# Patient Record
Sex: Female | Born: 1975
Health system: Southern US, Community
[De-identification: ages and names within clinical notes are randomized; demographics above are authoritative.]

## PROBLEM LIST (undated history)

## (undated) DIAGNOSIS — D649 Anemia, unspecified: Secondary | ICD-10-CM

## (undated) DIAGNOSIS — F419 Anxiety disorder, unspecified: Secondary | ICD-10-CM

## (undated) DIAGNOSIS — T7840XA Allergy, unspecified, initial encounter: Secondary | ICD-10-CM

## (undated) DIAGNOSIS — F53 Postpartum depression: Secondary | ICD-10-CM

## (undated) DIAGNOSIS — C801 Malignant (primary) neoplasm, unspecified: Secondary | ICD-10-CM

## (undated) DIAGNOSIS — K219 Gastro-esophageal reflux disease without esophagitis: Secondary | ICD-10-CM

## (undated) DIAGNOSIS — E079 Disorder of thyroid, unspecified: Secondary | ICD-10-CM

## (undated) DIAGNOSIS — J45909 Unspecified asthma, uncomplicated: Secondary | ICD-10-CM

## (undated) DIAGNOSIS — O99345 Other mental disorders complicating the puerperium: Secondary | ICD-10-CM

## (undated) HISTORY — DX: Allergy, unspecified, initial encounter: T78.40XA

## (undated) HISTORY — DX: Malignant (primary) neoplasm, unspecified: C80.1

## (undated) HISTORY — DX: Disorder of thyroid, unspecified: E07.9

## (undated) HISTORY — DX: Anemia, unspecified: D64.9

## (undated) HISTORY — DX: Unspecified asthma, uncomplicated: J45.909

## (undated) HISTORY — DX: Anxiety disorder, unspecified: F41.9

---

## 1999-08-14 ENCOUNTER — Other Ambulatory Visit: Admission: RE | Admit: 1999-08-14 | Discharge: 1999-08-14 | Payer: Self-pay | Admitting: Obstetrics and Gynecology

## 2002-01-26 ENCOUNTER — Other Ambulatory Visit: Admission: RE | Admit: 2002-01-26 | Discharge: 2002-01-26 | Payer: Self-pay | Admitting: Obstetrics & Gynecology

## 2003-02-28 ENCOUNTER — Other Ambulatory Visit: Admission: RE | Admit: 2003-02-28 | Discharge: 2003-02-28 | Payer: Self-pay | Admitting: Obstetrics & Gynecology

## 2004-03-10 ENCOUNTER — Other Ambulatory Visit: Admission: RE | Admit: 2004-03-10 | Discharge: 2004-03-10 | Payer: Self-pay | Admitting: Obstetrics and Gynecology

## 2008-08-02 ENCOUNTER — Ambulatory Visit: Payer: Self-pay | Admitting: Gynecology

## 2008-08-20 ENCOUNTER — Ambulatory Visit: Payer: Self-pay | Admitting: Gynecology

## 2008-08-22 ENCOUNTER — Ambulatory Visit: Payer: Self-pay | Admitting: Gynecology

## 2008-08-27 ENCOUNTER — Ambulatory Visit: Payer: Self-pay | Admitting: Gynecology

## 2008-08-28 ENCOUNTER — Ambulatory Visit: Payer: Self-pay | Admitting: Gynecology

## 2008-09-05 ENCOUNTER — Ambulatory Visit: Payer: Self-pay | Admitting: Gynecology

## 2009-04-29 ENCOUNTER — Inpatient Hospital Stay (HOSPITAL_COMMUNITY): Admission: AD | Admit: 2009-04-29 | Discharge: 2009-05-03 | Payer: Self-pay | Admitting: Obstetrics and Gynecology

## 2009-04-30 ENCOUNTER — Encounter (INDEPENDENT_AMBULATORY_CARE_PROVIDER_SITE_OTHER): Payer: Self-pay | Admitting: Obstetrics and Gynecology

## 2010-01-02 ENCOUNTER — Ambulatory Visit: Payer: Self-pay | Admitting: Family Medicine

## 2010-02-24 ENCOUNTER — Ambulatory Visit: Payer: Self-pay | Admitting: Occupational Medicine

## 2010-02-28 ENCOUNTER — Ambulatory Visit: Payer: Self-pay | Admitting: Family Medicine

## 2010-09-08 ENCOUNTER — Ambulatory Visit: Payer: Self-pay | Admitting: Emergency Medicine

## 2010-09-08 LAB — CONVERTED CEMR LAB: Rapid Strep: NEGATIVE

## 2010-10-09 ENCOUNTER — Ambulatory Visit: Payer: Self-pay | Admitting: Family Medicine

## 2010-10-09 DIAGNOSIS — J069 Acute upper respiratory infection, unspecified: Secondary | ICD-10-CM | POA: Insufficient documentation

## 2010-10-11 ENCOUNTER — Telehealth (INDEPENDENT_AMBULATORY_CARE_PROVIDER_SITE_OTHER): Payer: Self-pay | Admitting: *Deleted

## 2010-11-25 NOTE — Assessment & Plan Note (Signed)
Summary: Coughing no better rm 3   Vital Signs:  Patient Profile:   35 Years Old Female CC:      coughing no better Height:     65 inches Weight:      144 pounds O2 Sat:      99 % O2 treatment:    Room Air Temp:     97.8 degrees F oral Pulse rate:   60 / minute Pulse rhythm:   regular Resp:     16 per minute BP sitting:   111 / 79  (right arm) Cuff size:   regular  Vitals Entered By: Areta Haber CMA (Feb 28, 2010 8:17 AM)                  Current Allergies: No known allergies History of Present Illness Chief Complaint: coughing no better History of Present Illness: Subjective:  Patient complains of persistent cough, worse at night.  She often coughs until she gags and must sit up at night.  Cough generally non-productive.  No shortness of breath or fever.  Current Problems: ACUTE BRONCHITIS (ICD-466.0) UPPER RESPIRATORY INFECTION, ACUTE (ICD-465.9) FAMILY HISTORY OF MELANOMA (ICD-V16.8) FAMILY HISTORY OF COLON CA 1ST DEGREE RELATIVE <60 (ICD-V16.0) FAMILY HISTORY BREAST CANCER 1ST DEGREE RELATIVE <50 (ICD-V16.3)   Current Meds LUTERA 0.1-20 MG-MCG TABS (LEVONORGESTREL-ETHINYL ESTRAD) 1 tab by mouth once daily CELEXA 10 MG TABS (CITALOPRAM HYDROBROMIDE) 1 tab by mouth once daily BUSPIRONE HCL 5 MG TABS (BUSPIRONE HCL) 1 tab by mouth once daily NEXIUM 20 MG CPDR (ESOMEPRAZOLE MAGNESIUM) 1 tab by mouth once daily LIDOCAINE VISCOUS 2 % SOLN (LIDOCAINE HCL) 15ml by mouth q3 to 4hr as needed.  Swish and spit out.  Max 8 doses/day CHERATUSSIN AC 100-10 MG/5ML SYRP (GUAIFENESIN-CODEINE) 5cc by mouth hs as needed cough AZITHROMYCIN 250 MG TABS (AZITHROMYCIN) Two tabs by mouth on day 1, then 1 tab daily on days 2 through 5 BENZONATATE 200 MG CAPS (BENZONATATE) One by mouth hs as needed cough  REVIEW OF SYSTEMS Constitutional Symptoms      Denies fever, chills, night sweats, weight loss, weight gain, and fatigue.  Eyes       Denies change in vision, eye pain, eye  discharge, glasses, contact lenses, and eye surgery. Ear/Nose/Throat/Mouth       Complains of hoarseness.      Denies hearing loss/aids, change in hearing, ear pain, ear discharge, dizziness, frequent runny nose, frequent nose bleeds, sinus problems, sore throat, and tooth pain or bleeding.  Respiratory       Complains of dry cough.      Denies productive cough, wheezing, shortness of breath, asthma, bronchitis, and emphysema/COPD.  Cardiovascular       Denies murmurs, chest pain, and tires easily with exhertion.    Gastrointestinal       Denies stomach pain, nausea/vomiting, diarrhea, constipation, blood in bowel movements, and indigestion. Genitourniary       Denies painful urination, kidney stones, and loss of urinary control. Neurological       Denies paralysis, seizures, and fainting/blackouts. Musculoskeletal       Denies muscle pain, joint pain, joint stiffness, decreased range of motion, redness, swelling, muscle weakness, and gout.  Skin       Denies bruising, unusual mles/lumps or sores, and hair/skin or nail changes.  Psych       Denies mood changes, temper/anger issues, anxiety/stress, speech problems, depression, and sleep problems. Other Comments: Pt states her coughing has not gotten any better.  Past History:  Past Medical History: Last updated: 01/02/2010 Post Partum Acid Reflux  Past Surgical History: Last updated: 01/02/2010 Caesarean section  Family History: Last updated: 01/02/2010 Family History Breast cancer 1st degree relative <50 Family History of Colon CA 1st degree relative <60 Family History of Melanoma Family History Other cancer Family History of Cardiovascular disorder  Social History: Last updated: 01/02/2010 Married Never Smoked Alcohol use-yes - wine a couple times weekly Drug use-no Regular exercise-yes  Risk Factors: Exercise: yes (01/02/2010)  Risk Factors: Smoking Status: never (01/02/2010)   Objective:  No acute  distress  Eyes:  Pupils are equal, round, and reactive to light and accomdation.  Extraocular movement is intact.  Conjunctivae are not inflamed.  Ears:  Canals normal.  Tympanic membranes normal.   Nose:  Normal septum.  Normal turbinates, mildly congested.    No sinus tenderness present.  Pharynx:  Normal  Neck:  Supple.  No adenopathy is present.  No thyromegaly is present  Lungs:  Clear to auscultation.  Breath sounds are equal.  Heart:  Regular rate and rhythm without murmurs, rubs, or gallops.  Abdomen:  Nontender without masses or hepatosplenomegaly.  Bowel sounds are present.  No CVA or flank tenderness.  Extremities:  No edema.   Assessment New Problems: ACUTE BRONCHITIS (ICD-466.0)  ? BRONCHITIS  Plan New Medications/Changes: BENZONATATE 200 MG CAPS (BENZONATATE) One by mouth hs as needed cough  #12 x 1, 02/28/2010, Donna Christen MD AZITHROMYCIN 250 MG TABS (AZITHROMYCIN) Two tabs by mouth on day 1, then 1 tab daily on days 2 through 5  #6 tabs x 0, 02/28/2010, Donna Christen MD  New Orders: Est. Patient Level III [phone] Planning Comments:   Begin Z-pack.  Switch to Tessalon at bedtime.  Expectorant/decongestant daytime with plenty of fluids. Follow-up with PCP if not improving.   The patient and/or caregiver has been counseled thoroughly with regard to medications prescribed including dosage, schedule, interactions, rationale for use, and possible side effects and they verbalize understanding.  Diagnoses and expected course of recovery discussed and will return if not improved as expected or if the condition worsens. Patient and/or caregiver verbalized understanding.  Prescriptions: BENZONATATE 200 MG CAPS (BENZONATATE) One by mouth hs as needed cough  #12 x 1   Entered and Authorized by:   Donna Christen MD   Signed by:   Donna Christen MD on 02/28/2010   Method used:   Print then Give to Patient   RxID:   [id number] AZITHROMYCIN 250 MG TABS (AZITHROMYCIN) Two tabs  by mouth on day 1, then 1 tab daily on days 2 through 5  #6 tabs x 0   Entered and Authorized by:   Donna Christen MD   Signed by:   Donna Christen MD on 02/28/2010   Method used:   Print then Give to Patient   RxID:   [id number]   Patient Instructions: 1)  May use Mucinex D (guaifenesin with decongestant) twice daily for congestion. 2)  Increase fluid intake, rest. 3)  May use Afrin nasal spray (or generic oxymetazoline) twice daily for about 5 days.  Also recommend using saline nasal spray several times daily and/or saline nasal irrigation. 4)  Followup with family doctor if not improving one week.

## 2010-11-25 NOTE — Assessment & Plan Note (Signed)
Summary: EAR PAIN,SINUS PROBLEMS/TJ (rm 3)   Vital Signs:  Patient Profile:   35 Years Old Female CC:      left ear pain x 1 day, sinus pressure x 3 days Height:     65 inches Weight:      134 pounds O2 Sat:      99 % O2 treatment:    Room Air Temp:     98.5 degrees F oral Pulse rate:   66 / minute Resp:     12 per minute BP sitting:   97 / 61  (left arm) Cuff size:   regular  Pt. in pain?   yes    Location:   left ear  Vitals Entered By: Lajean Saver RN (September 08, 2010 8:29 AM)                   Updated Prior Medication List: LUTERA 0.1-20 MG-MCG TABS (LEVONORGESTREL-ETHINYL ESTRAD) 1 tab by mouth once daily CELEXA 10 MG TABS (CITALOPRAM HYDROBROMIDE) 1 tab by mouth once daily BUSPIRONE HCL 5 MG TABS (BUSPIRONE HCL) 1 tab by mouth once daily NEXIUM 20 MG CPDR (ESOMEPRAZOLE MAGNESIUM) 1 tab by mouth once daily SUDAFED 30 MG TABS (PSEUDOEPHEDRINE HCL)  TYLENOL 325 MG TABS (ACETAMINOPHEN)   Current Allergies: No known allergies History of Present Illness Chief Complaint: left ear pain x 1 day, sinus pressure x 3 days History of Present Illness: Patient complains of onset of cold symptoms for 5 days.  They have been using Sudafed & Tylenol which is helping a little bit. + L sore throat No cough No pleuritic pain No wheezing + nasal congestion + post-nasal drainage + sinus pain/pressure No itchy/red eyes + L earache No hemoptysis No SOB + chills/sweats No fever No nausea No vomiting No abdominal pain No diarrhea No skin rashes No fatigue No myalgias No headache   REVIEW OF SYSTEMS Constitutional Symptoms      Denies fever, chills, night sweats, weight loss, weight gain, and fatigue.  Eyes       Denies change in vision, eye pain, eye discharge, glasses, contact lenses, and eye surgery. Ear/Nose/Throat/Mouth       Complains of ear pain and sinus problems.      Denies hearing loss/aids, change in hearing, ear discharge, dizziness, frequent runny  nose, frequent nose bleeds, sore throat, hoarseness, and tooth pain or bleeding.      Comments: left ear Respiratory       Denies dry cough, productive cough, wheezing, shortness of breath, asthma, bronchitis, and emphysema/COPD.  Cardiovascular       Denies murmurs, chest pain, and tires easily with exhertion.    Gastrointestinal       Denies stomach pain, nausea/vomiting, diarrhea, constipation, blood in bowel movements, and indigestion. Genitourniary       Denies painful urination, kidney stones, and loss of urinary control. Neurological       Denies paralysis, seizures, and fainting/blackouts. Musculoskeletal       Denies muscle pain, joint pain, joint stiffness, decreased range of motion, redness, swelling, muscle weakness, and gout.  Skin       Denies bruising, unusual mles/lumps or sores, and hair/skin or nail changes.  Psych       Denies mood changes, temper/anger issues, anxiety/stress, speech problems, depression, and sleep problems. Other Comments: patient c/o severe sinus pressure x 3 days mostly in the evenings and left ear pain radiating into her jaw x 1 day   Past History:  Past Medical History:  Acid Reflux  Past Surgical History: Caesarean section 04/2009  Family History: Reviewed history from 01/02/2010 and no changes required. Family History Breast cancer 1st degree relative <50 Family History of Colon CA 1st degree relative <60 Family History of Melanoma Family History Other cancer Family History of Cardiovascular disorder  Social History: Reviewed history from 01/02/2010 and no changes required. Married Never Smoked Alcohol use-yes - wine a couple times weekly Drug use-no Regular exercise-yes Physical Exam General appearance: well developed, well nourished, no acute distress Oral/Pharynx: pharyngeal erythema with L exudate & tonsillar enlargment, uvula midline without deviation Neck: tender ant cerv LAD Chest/Lungs: no rales, wheezes, or rhonchi  bilateral, breath sounds equal without effort Heart: regular rate and  rhythm, no murmur Skin: no obvious rashes or lesions MSE: oriented to time, place, and person Assessment New Problems: ACUTE TONSILLITIS (ICD-463)   Patient Education: Patient and/or caregiver instructed in the following: rest, fluids, Tylenol prn.  Plan New Medications/Changes: AMOXICILLIN 875 MG TABS (AMOXICILLIN) 1 tab by mouth two times a day for 10 days  #20 x 0, 09/08/2010, Hoyt Koch MD  New Orders: Est. Patient Level III [84696] Rapid Strep [29528] Planning Comments:   1)  Take the prescribed antibiotic as instructed. 2)  Use nasal saline solution (over the counter) at least 3 times a day. 3)  Use over the counter decongestants like Zyrtec-D every 12 hours as needed to help with congestion. 4)  Can take tylenol every 6 hours or motrin every 8 hours for pain or fever. 5)  Follow up with your primary doctor  if no improvement in 5-7 days, sooner if increasing pain, fever, or new symptoms.     The patient and/or caregiver has been counseled thoroughly with regard to medications prescribed including dosage, schedule, interactions, rationale for use, and possible side effects and they verbalize understanding.  Diagnoses and expected course of recovery discussed and will return if not improved as expected or if the condition worsens. Patient and/or caregiver verbalized understanding.  Prescriptions: AMOXICILLIN 875 MG TABS (AMOXICILLIN) 1 tab by mouth two times a day for 10 days  #20 x 0   Entered and Authorized by:   Hoyt Koch MD   Signed by:   Hoyt Koch MD on 09/08/2010   Method used:   Telephoned to ...       [facility]  [address] [phone]* (retail)       [address] [place], [place]  [address]       Ph: [phone] or [phone]       Fax: [phone]   RxID:   [id number]  Amoxicillin 875mg  1 tab by mouth two times a day x 10 dys # 20 w/nr  clld into [facility]/Pharmacist @  [facility] [phone]. Areta Haber CMA  September 08, 2010 8:45 AM  Orders Added: 1)  Est. Patient Level III [99213] 2)  Rapid Strep [02542]    Laboratory Results  Date/Time Received: September 08, 2010 8:52 AM  Date/Time Reported: September 08, 2010 8:52 AM   Other Tests  Rapid Strep: negative  Kit Test Internal QC: Negative   (Normal Range: Negative)

## 2010-11-25 NOTE — Assessment & Plan Note (Signed)
Summary: SEVERE SORE THROAT/COUGH   Vital Signs:  Patient Profile:   35 Years Old Female CC:      Sore throat, cough x 3 days Height:     65 inches Weight:      138 pounds O2 Sat:      97 % O2 treatment:    Room Air Temp:     98.2 degrees F oral Pulse rate:   69 / minute Pulse rhythm:   regular Resp:     12 per minute BP sitting:   104 / 75  (right arm) Cuff size:   regular  Vitals Entered By: Emilio Math (Feb 24, 2010 8:22 AM)                  Current Allergies (reviewed today): No known allergies History of Present Illness Chief Complaint: Sore throat, cough x 3 days History of Present Illness: Presents with complaints of sore throat and cough for the last 3 days.  No reports of fever.   Cough is non productive and keeps her up at night.   Also complains of sinus congestion.   63 month old child has had a similar URI.   No reports of ear pain. Rapid strep test was negative.   Current Meds LUTERA 0.1-20 MG-MCG TABS (LEVONORGESTREL-ETHINYL ESTRAD) 1 tab by mouth once daily CELEXA 10 MG TABS (CITALOPRAM HYDROBROMIDE) 1 tab by mouth once daily BUSPIRONE HCL 5 MG TABS (BUSPIRONE HCL) 1 tab by mouth once daily NEXIUM 20 MG CPDR (ESOMEPRAZOLE MAGNESIUM) 1 tab by mouth once daily LIDOCAINE VISCOUS 2 % SOLN (LIDOCAINE HCL) 15ml by mouth q3 to 4hr as needed.  Swish and spit out.  Max 8 doses/day CHERATUSSIN AC 100-10 MG/5ML SYRP (GUAIFENESIN-CODEINE) 5cc by mouth hs as needed cough  REVIEW OF SYSTEMS Constitutional Symptoms      Denies fever, chills, night sweats, weight loss, weight gain, and fatigue.  Eyes       Denies change in vision, eye pain, eye discharge, glasses, contact lenses, and eye surgery. Ear/Nose/Throat/Mouth       Complains of sore throat and hoarseness.      Denies hearing loss/aids, change in hearing, ear pain, ear discharge, dizziness, frequent runny nose, frequent nose bleeds, sinus problems, and tooth pain or bleeding.  Respiratory       Complains of  dry cough.      Denies productive cough, wheezing, shortness of breath, asthma, bronchitis, and emphysema/COPD.  Cardiovascular       Denies murmurs, chest pain, and tires easily with exhertion.    Gastrointestinal       Denies stomach pain, nausea/vomiting, diarrhea, constipation, blood in bowel movements, and indigestion. Genitourniary       Denies painful urination, kidney stones, and loss of urinary control. Neurological       Denies paralysis, seizures, and fainting/blackouts. Musculoskeletal       Denies muscle pain, joint pain, joint stiffness, decreased range of motion, redness, swelling, muscle weakness, and gout.  Skin       Denies bruising, unusual mles/lumps or sores, and hair/skin or nail changes.  Psych       Denies mood changes, temper/anger issues, anxiety/stress, speech problems, depression, and sleep problems.  Past History:  Past Medical History: Reviewed history from 01/02/2010 and no changes required. Post Partum Acid Reflux  Past Surgical History: Reviewed history from 01/02/2010 and no changes required. Caesarean section  Family History: Reviewed history from 01/02/2010 and no changes required. Family History Breast cancer  1st degree relative <50 Family History of Colon CA 1st degree relative <60 Family History of Melanoma Family History Other cancer Family History of Cardiovascular disorder  Social History: Reviewed history from 01/02/2010 and no changes required. Married Never Smoked Alcohol use-yes - wine a couple times weekly Drug use-no Regular exercise-yes Physical Exam General appearance: well developed, well nourished, no acute distress Pupils: equal, round, reactive to light Ears: normal, no lesions or deformities Nasal: swollen red turbinates with congestion Oral/Pharynx: pharyngeal erythema without exudate, uvula midline without deviation Neck: supple,anterior lymphadenopathy present Chest/Lungs: no rales, wheezes, or rhonchi  bilateral, breath sounds equal without effort Heart: regular rate and  rhythm, no murmur Assessment New Problems: UPPER RESPIRATORY INFECTION, ACUTE (ICD-465.9)   Plan New Medications/Changes: CHERATUSSIN AC 100-10 MG/5ML SYRP (GUAIFENESIN-CODEINE) 5cc by mouth hs as needed cough  #2 oz x 0, 02/24/2010, Kathrine Haddock MD LIDOCAINE VISCOUS 2 % SOLN (LIDOCAINE HCL) 15ml by mouth q3 to 4hr as needed.  Swish and spit out.  Max 8 doses/day  #200cc x 0, 02/24/2010, Kathrine Haddock MD  New Orders: New Patient Level III [phone] Planning Comments:   Antibiotics not recommended at this time Supportive care Follow up if symptoms do not improve in 3-4 days.   The patient and/or caregiver has been counseled thoroughly with regard to medications prescribed including dosage, schedule, interactions, rationale for use, and possible side effects and they verbalize understanding.  Diagnoses and expected course of recovery discussed and will return if not improved as expected or if the condition worsens. Patient and/or caregiver verbalized understanding.  Prescriptions: CHERATUSSIN AC 100-10 MG/5ML SYRP (GUAIFENESIN-CODEINE) 5cc by mouth hs as needed cough  #2 oz x 0   Entered and Authorized by:   Kathrine Haddock MD   Signed by:   Kathrine Haddock MD on 02/24/2010   Method used:   Print then Give to Patient   RxID:   [id number] LIDOCAINE VISCOUS 2 % SOLN (LIDOCAINE HCL) 15ml by mouth q3 to 4hr as needed.  Swish and spit out.  Max 8 doses/day  #200cc x 0   Entered and Authorized by:   Kathrine Haddock MD   Signed by:   Kathrine Haddock MD on 02/24/2010   Method used:   Print then Give to Patient   RxID:   [id number]   Appended Document: SEVERE SORE THROAT/COUGH Rapid strep: Neg

## 2010-11-25 NOTE — Assessment & Plan Note (Signed)
Summary: [place] -  Both x this afternoon rm3   Vital Signs:  Patient Profile:   35 Years Old Female CC:      Cold & URI symptoms Height:     65 inches O2 Sat:      99 % O2 treatment:    Room Air Temp:     97.7 degrees F oral Pulse rate:   61 / minute Pulse rhythm:   regular Resp:     16 per minute BP sitting:   115 / 74  (right arm) Cuff size:   regular  Vitals Entered By: Areta Haber CMA (January 02, 2010 6:34 PM)                  Current Allergies: No known allergies History of Present Illness Chief Complaint: Cold & URI symptoms History of Present Illness: ONSET TODAY WITH PRESSURE IN EARS. NO FEVER, TEETH ARE HURTING. NO COUGH. SCRATCHY THROAT. WAS ON A FLIGHT A WK AGO. NO TX PTA.   Current Problems: UPPER RESPIRATORY INFECTION, ACUTE (ICD-465.9) FAMILY HISTORY OF MELANOMA (ICD-V16.8) FAMILY HISTORY OF COLON CA 1ST DEGREE RELATIVE <60 (ICD-V16.0) FAMILY HISTORY BREAST CANCER 1ST DEGREE RELATIVE <50 (ICD-V16.3)   Current Meds LUTERA 0.1-20 MG-MCG TABS (LEVONORGESTREL-ETHINYL ESTRAD) 1 tab by mouth once daily CELEXA 10 MG TABS (CITALOPRAM HYDROBROMIDE) 1 tab by mouth once daily BUSPIRONE HCL 5 MG TABS (BUSPIRONE HCL) 1 tab by mouth once daily NEXIUM 20 MG CPDR (ESOMEPRAZOLE MAGNESIUM) 1 tab by mouth once daily ZITHROMAX Z-PAK 250 MG TABS (AZITHROMYCIN) TAKE AS DIRECTED  REVIEW OF SYSTEMS Constitutional Symptoms      Denies fever, chills, night sweats, weight loss, weight gain, and fatigue.  Eyes       Denies change in vision, eye pain, eye discharge, glasses, contact lenses, and eye surgery. Ear/Nose/Throat/Mouth       Complains of ear pain.      Denies hearing loss/aids, change in hearing, ear discharge, dizziness, frequent runny nose, frequent nose bleeds, sinus problems, sore throat, hoarseness, and tooth pain or bleeding.      Comments: Both x this afternoon Respiratory       Denies dry cough, productive cough, wheezing, shortness of breath, asthma,  bronchitis, and emphysema/COPD.  Cardiovascular       Denies murmurs, chest pain, and tires easily with exhertion.    Gastrointestinal       Denies stomach pain, nausea/vomiting, diarrhea, constipation, blood in bowel movements, and indigestion. Genitourniary       Denies painful urination, kidney stones, and loss of urinary control. Neurological       Denies paralysis, seizures, and fainting/blackouts. Musculoskeletal       Denies muscle pain, joint pain, joint stiffness, decreased range of motion, redness, swelling, muscle weakness, and gout.  Skin       Denies bruising, unusual mles/lumps or sores, and hair/skin or nail changes.  Psych       Denies mood changes, temper/anger issues, anxiety/stress, speech problems, depression, and sleep problems.  Past History:  Past Medical History: Post Partum Acid Reflux  Past Surgical History: Caesarean section  Family History: Family History Breast cancer 1st degree relative <50 Family History of Colon CA 1st degree relative <60 Family History of Melanoma Family History Other cancer Family History of Cardiovascular disorder  Social History: Married Never Smoked Alcohol use-yes - wine a couple times weekly Drug use-no Regular exercise-yes Smoking Status:  never Drug Use:  no Does Patient Exercise:  yes Physical Exam General appearance: well  developed, well nourished, no acute distress Eyes: conjunctivae and lids normal Ears: normal, no lesions or deformities. NO ERYTHEMA Nasal: CONGESTED SLIGHTLY Oral/Pharynx: tongue normal, posterior pharynx without erythema or exudate Neck: neck supple,  trachea midline, no masses Chest/Lungs: no rales, wheezes, or rhonchi bilateral, breath sounds equal without effort Heart: regular rate and  rhythm, no murmur Skin: no obvious rashes or lesions Assessment New Problems: UPPER RESPIRATORY INFECTION, ACUTE (ICD-465.9) FAMILY HISTORY OF MELANOMA (ICD-V16.8) FAMILY HISTORY OF COLON CA 1ST  DEGREE RELATIVE <60 (ICD-V16.0) FAMILY HISTORY BREAST CANCER 1ST DEGREE RELATIVE <50 (ICD-V16.3)   Plan New Medications/Changes: ZITHROMAX Z-PAK 250 MG TABS (AZITHROMYCIN) TAKE AS DIRECTED  #1PK x 0, 01/02/2010, Marvis Moeller DO  New Orders: New Patient Level III [99203]   Prescriptions: ZITHROMAX Z-PAK 250 MG TABS (AZITHROMYCIN) TAKE AS DIRECTED  #1PK x 0   Entered and Authorized by:   Marvis Moeller DO   Signed by:   Marvis Moeller DO on 01/02/2010   Method used:   Print then Give to Patient   RxID:   [id number]   Patient Instructions: 1)  TYLENOL AS NEEDED. MUCINEX D. AVOID MILK AND CAFFEINE PRODUCTS. FOLLOW UP WITH YOUR PCP OR RETURN IF SYMPTOMS PERSIST. MAY WORSEN PRIOR TO IMPROVING.

## 2010-11-27 NOTE — Progress Notes (Signed)
  Phone Note Outgoing Call   Call placed by: Clemens Catholic LPN,  October 11, 2010 11:05 AM Call placed to: Patient Action Taken: Phone Call Completed Summary of Call: call back: called to F/U with pt. she states that she is feeling better. advised pt to cont meds as prescribed and F/U with PCP if she does not cont. to improve. Initial call taken by: Clemens Catholic LPN,  October 11, 2010 11:07 AM

## 2010-11-27 NOTE — Assessment & Plan Note (Signed)
Summary: CONGESTION,SORE THROAT,COUGH/WSE (rm 5)   Vital Signs:  Patient Profile:   35 Years Old Female CC:      cough, sore throat, congestion  Height:     65 inches Weight:      134 pounds O2 Sat:      100 % O2 treatment:    Room Air Temp:     98.4 degrees F oral Pulse rate:   60 / minute Resp:     12 per minute BP sitting:   110 / 73  (left arm) Cuff size:   regular  Vitals Entered By: Lajean Saver RN (October 09, 2010 8:26 AM)                  Updated Prior Medication List: LUTERA 0.1-20 MG-MCG TABS (LEVONORGESTREL-ETHINYL ESTRAD) 1 tab by mouth once daily CELEXA 10 MG TABS (CITALOPRAM HYDROBROMIDE) 1 tab by mouth once daily BUSPIRONE HCL 5 MG TABS (BUSPIRONE HCL) 1 tab by mouth once daily NEXIUM 20 MG CPDR (ESOMEPRAZOLE MAGNESIUM) 1 tab by mouth once daily SUDAFED 30 MG TABS (PSEUDOEPHEDRINE HCL)  DELSYM NIGHT TIME COUGH/COLD 15-6.25-500 MG/15ML LIQD (DM-DOXYLAMINE-ACETAMINOPHEN)  MUCINEX 600 MG XR12H-TAB (GUAIFENESIN)   Current Allergies: No known allergies History of Present Illness Chief Complaint: cough, sore throat, congestion  History of Present Illness:  Subjective: Patient complains of URI symptoms that started almost 2 weeks ago with a sore throat.  The sore throat resolved, then recurred one week ago with nasal congestion and cough.  All symptoms have now worsened.  Her cough is worse at night and not responding to [employer].   No pleuritic pain No wheezing + post-nasal drainage ? sinus pain/pressure No itchy/red eyes No earache No hemoptysis No SOB No fever/chills No nausea No vomiting No abdominal pain No diarrhea No skin rashes + fatigue No myalgias + headache Used OTC meds without relief   REVIEW OF SYSTEMS Constitutional Symptoms      Denies fever, chills, night sweats, weight loss, weight gain, and fatigue.  Eyes       Denies change in vision, eye pain, eye discharge, glasses, contact lenses, and eye  surgery. Ear/Nose/Throat/Mouth       Complains of sinus problems, sore throat, and hoarseness.      Denies hearing loss/aids, change in hearing, ear pain, ear discharge, dizziness, frequent runny nose, frequent nose bleeds, and tooth pain or bleeding.      Comments: congestion Respiratory       Complains of productive cough.      Denies dry cough, wheezing, shortness of breath, asthma, bronchitis, and emphysema/COPD.  Cardiovascular       Denies murmurs, chest pain, and tires easily with exhertion.    Gastrointestinal       Denies stomach pain, nausea/vomiting, diarrhea, constipation, blood in bowel movements, and indigestion. Genitourniary       Denies painful urination, kidney stones, and loss of urinary control. Neurological       Denies paralysis, seizures, and fainting/blackouts. Musculoskeletal       Denies muscle pain, joint pain, joint stiffness, decreased range of motion, redness, swelling, muscle weakness, and gout.  Skin       Denies bruising, unusual mles/lumps or sores, and hair/skin or nail changes.  Psych       Denies mood changes, temper/anger issues, anxiety/stress, speech problems, depression, and sleep problems. Other Comments: Patient had symptoms for about a week recently, went out of town and they resolved. When she came back Sunday (4days ago) symptoms  returned. She has tried sudafed, mucinex and delsym OTC   Past History:  Past Medical History: Reviewed history from 09/08/2010 and no changes required.  Acid Reflux  Past Surgical History: Reviewed history from 09/08/2010 and no changes required. Caesarean section 04/2009  Family History: Reviewed history from 01/02/2010 and no changes required. Family History Breast cancer 1st degree relative <50 Family History of Colon CA 1st degree relative <60 Family History of Melanoma Family History Other cancer Family History of Cardiovascular disorder  Social History: Reviewed history from 01/02/2010 and no  changes required. Married Never Smoked Alcohol use-yes - wine a couple times weekly Drug use-no Regular exercise-yes   Objective:  Appearance:  Patient appears healthy, stated age, and in no acute distress  Eyes:  Pupils are equal, round, and reactive to light and accomdation.  Extraocular movement is intact.  Conjunctivae are not inflamed.  Ears:  Canals normal.  Tympanic membranes normal.   Nose:  Normal septum.  Normal turbinates, mildly congested.   No sinus tenderness present.  Pharynx:  Normal  Neck:  Supple.  Tender shotty posterior nodes are palpated bilaterally.  Lungs:  Clear to auscultation.  Breath sounds are equal.  Heart:  Regular rate and rhythm without murmurs, rubs, or gallops.  Abdomen:  Nontender without masses or hepatosplenomegaly.  Bowel sounds are present.  No CVA or flank tenderness.  Extremities:  No edema.   Skin:  No rash Assessment New Problems: UPPER RESPIRATORY INFECTION, ACUTE (ICD-465.9)  NO EVIDENCE BACTERIAL INFECTION.  THIS APPEARS TO BE AT LEAST THE 4TH VIRAL RESPIRATORY INFECTION THIS YEAR FOR PATIENT.  SUSPECT DECREASED IMMUNE STATUS  Plan New Medications/Changes: AZITHROMYCIN 250 MG TABS (AZITHROMYCIN) Two tabs by mouth on day 1, then 1 tab daily on days 2 through 5  #6 tabs x 0, 10/09/2010, Donna Christen MD Sandria Senter ER 8-10 MG/5ML LQCR (CHLORPHENIRAMINE-HYDROCODONE) 5 cc by mouth hs as needed cough  #2oz x 0, 10/09/2010, Donna Christen MD  New Orders: Pulse Oximetry (single measurment) [94760] Rapid Strep [21308] Est. Patient Level III [65784] Planning Comments:   Begin Z-pack, expectorant/decongestant daytime, cough suppressant at bedtime.  Increase fluid intake. Recommend daily multiple vitamin with D.  Recommend Tdap when well.  She states that she has had a flu shot this season. Followup with PCP if not improving 7 to 10 days   The patient and/or caregiver has been counseled thoroughly with regard to medications  prescribed including dosage, schedule, interactions, rationale for use, and possible side effects and they verbalize understanding.  Diagnoses and expected course of recovery discussed and will return if not improved as expected or if the condition worsens. Patient and/or caregiver verbalized understanding.  Prescriptions: AZITHROMYCIN 250 MG TABS (AZITHROMYCIN) Two tabs by mouth on day 1, then 1 tab daily on days 2 through 5  #6 tabs x 0   Entered and Authorized by:   Donna Christen MD   Signed by:   Donna Christen MD on 10/09/2010   Method used:   Print then Give to Patient   RxID:   [id number] TUSSIONEX PENNKINETIC ER 8-10 MG/5ML LQCR (CHLORPHENIRAMINE-HYDROCODONE) 5 cc by mouth hs as needed cough  #2oz x 0   Entered and Authorized by:   Donna Christen MD   Signed by:   Donna Christen MD on 10/09/2010   Method used:   Print then Give to Patient   RxID:   [id number]   Patient Instructions: 1)  May use Mucinex D (guaifenesin with decongestant) or plain Mucinex twice  daily for congestion. 2)  Increase fluid intake, rest. 3)  May use Afrin nasal spray (or generic oxymetazoline) twice daily for about 5 days.  Also recommend using saline nasal spray several times daily and/or saline nasal irrigation. 4)  Followup with family doctor if not improving 7 to 10 days.  Orders Added: 1)  Pulse Oximetry (single measurment) [94760] 2)  Rapid Strep [04540] 3)  Est. Patient Level III [98119]    Laboratory Results  Date/Time Received: October 09, 2010 8:30 AM  Date/Time Reported: October 09, 2010 8:30 AM   Other Tests  Rapid Strep: negative  Kit Test Internal QC: Negative   (Normal Range: Negative)

## 2011-02-01 LAB — SYPHILIS: RPR W/REFLEX TO RPR TITER AND TREPONEMAL ANTIBODIES, TRADITIONAL SCREENING AND DIAGNOSIS ALGORITHM: RPR Ser Ql: NONREACTIVE

## 2011-02-01 LAB — CBC
HCT: 32 % — ABNORMAL LOW (ref 36.0–46.0)
MCHC: 34.4 g/dL (ref 30.0–36.0)
MCV: 96 fL (ref 78.0–100.0)
MCV: 96.1 fL (ref 78.0–100.0)
Platelets: 235 10*3/uL (ref 150–400)
RBC: 3.89 MIL/uL (ref 3.87–5.11)
RDW: 12.7 % (ref 11.5–15.5)
RDW: 13 % (ref 11.5–15.5)
WBC: 8.7 10*3/uL (ref 4.0–10.5)

## 2011-03-10 NOTE — Op Note (Signed)
NAME:  Sandra Roberson, Sandra Roberson         ACCOUNT NO.:  [id number]   MEDICAL RECORD NO.:  [id number]           PATIENT TYPE:   LOCATION:                                 FACILITY:   PHYSICIAN:  Lenoard Aden, M.D.DATE OF BIRTH:  11-Aug-1976   DATE OF PROCEDURE:  04/30/2009  DATE OF DISCHARGE:                               OPERATIVE REPORT   PREOPERATIVE DIAGNOSIS:  A 40 plus week intrauterine pregnancy with  nonreassuring fetal heart rate tracing of fetal bradycardia in the 60s  for 3 minutes after prolonged previous bradycardia.   POSTOPERATIVE DIAGNOSIS:  A 40 plus week intrauterine pregnancy with  nonreassuring fetal heart rate tracing of fetal bradycardia in the 60s  for 3 minutes after prolonged previous bradycardia.   PROCEDURE:  Primary low segment transverse cesarean section.   SURGEON:  Lenoard Aden, MD   ANESTHESIA:  Epidural.   ESTIMATED BLOOD LOSS:  1000 mL.   COMPLICATIONS:  None.   DRAINS:  Foley.   COUNTS:  Correct.  Patient to recovery in good condition.   BRIEF OPERATIVE NOTE:  After being apprised the risks of anesthesia,  infection, bleeding, injury to abdominal organs, need for repair, the  patient was brought to the operating room urgently whereby she was dosed  with a epidural anesthetic, prepped and draped in usual sterile fashion.  Foley catheter previously placed.  Pfannenstiel skin incision made with  the scalpel, carried down the fascia, which nicked in the midline  transversely using blunt dissection.  Rectus muscles dissected sharply.  Peritoneum entered bluntly.  Bladder blade placed.  Visceral peritoneum  scored sharply off lower uterine segment.  Kerr hysterotomy incision  made atraumatic delivery from an occiput transverse position.  Full-term  living female handed to pediatricians in attendance, Apgars 8 and 9.  Cord  pH 7.31.  Cord blood collected.  Placenta delivered manually, intact  three-vessel cord.  Uterus exteriorized, curetted  using a dry lap pack  and closed in 2 running imbricating layers of 0 Monocryl suture.  Good  hemostasis noted.  Irrigation accomplished.  Bladder flap inspected,  found to be hemostatic.  Parietal peritoneum closed using 2-0  chromic in a continuous running fashion.  The fascia closed using a 0  Vicryl in continuous running fashion.  Subcutaneous tissue  reapproximated using 2-0 plain in a continuous running fashion.  Skin  closed using staples.  The patient tolerated the procedure well and was  transferred to recovery in good condition.      Lenoard Aden, M.D.  Electronically Signed     RJT/MEDQ  D:  04/30/2009  T:  05/01/2009  Job:  [id number]

## 2011-03-10 NOTE — Discharge Summary (Signed)
NAME:  Sandra Roberson, Sandra Roberson         ACCOUNT NO.:  [id number]   MEDICAL RECORD NO.:  [id number]          PATIENT TYPE:  INP   LOCATION:  9129                          FACILITY:  [facility]   PHYSICIAN:  Lenoard Aden, M.D.DATE OF BIRTH:  Mar 27, 1976   DATE OF ADMISSION:  04/29/2009  DATE OF DISCHARGE:  05/03/2009                               DISCHARGE SUMMARY   The patient was admitted on April 29, 2009, for induction for postdates  and oligohydramnios, is being discharged on May 05, 2009.  The patient  is a G1, P1 with an EDC of May 17, 2009.  Began prenatal care at  [facility] at 7 weeks, primary Ded[facility] Heffner at [facility] with Dr.  Billy Coast.   Prenatal labs include that the patient is HIV negative, she is rubella  immune, hepatitis B negative, RPR nonreactive, ABO, Rh is O negative.  She was on medications at the time of delivery include Nexium and  Tylenol.  She has no known allergies.  Pregnancy course was  uncomplicated.  No significant contributing medical history and no  previous surgeries.  The patient was admitted for induction and had a C-  section due to nonreassuring fetal heart rate.   Preop labs include a CBC with a white blood count of 10.5, hemoglobin  12.8, hematocrit 37.3, and platelet counts of 290.  Postoperatively, CBC  reveals a white count of 8.7, hemoglobin 11.1, hematocrit 32.0, and  platelets of 235.  Delivery was performed by Dr. Billy Coast, a female infant,  weighing 6 pounds 9 ounces, Apgars were 9 and 9, infant was sent to  regular nursery.  Infant was 20-1/4 inches long.   Physical exam at the time of discharge is that the patient is stable.  Vital signs include temperature of 97.7, heart rate 67, respiratory rate  18, blood pressure is 114/80.  Staples are removed, Benzoin applied, and  Steri-Strips applied.  Incision is dry and intact at the time of  discharge.  RhoGAM was not necessary as baby is negative.  Rubella  status is immune at the time of  discharge.   [facility] discharge booklet was given.   Prescriptions include Percocet 1-2 tablets p.o. every 4 hours and  ibuprofen 600 mg every 6 hours as needed for pain.   Activity as per booklet and follow up will be in 6 weeks in the office  with Dr. Billy Coast.      Arlana Lindau, NP      Lenoard Aden, M.D.  Electronically Signed    JF/MEDQ  D:  05/03/2009  T:  05/03/2009  Job:  [id number]

## 2011-03-10 NOTE — H&P (Signed)
NAME:  Sandra Roberson, Sandra Roberson         ACCOUNT NO.:  [id number]   MEDICAL RECORD NO.:  [id number]          PATIENT TYPE:  INP   LOCATION:  9173                          FACILITY:  [facility]   PHYSICIAN:  Lenoard Aden, M.D.DATE OF BIRTH:  [birth year]   DATE OF ADMISSION:  04/29/2009  DATE OF DISCHARGE:                              HISTORY & PHYSICAL   CHIEF COMPLAINT:  Post dates, oligohydramnios.   She is a 35 year old white female G1, P0 at 40-2/7 weeks' gestation who  presents now for cervical ripening and induction.  She had an AFI in the  office of 7 with an estimated fetal weight in the 19th percentile and a  BPP of 8/8.   PAST MEDICAL HISTORY:  Remarkable for unspecified urinary retention.   ALLERGIES:  She has no known drug allergies.   MEDICATIONS:  Nexium, Tylenol as needed, and prenatal vitamins.   SOCIAL HISTORY:  She is a nonsmoker, nondrinker.  She denies domestic or  physical violence.   FAMILY HISTORY:  COPD, breast cancer, cardiovascular disease, colon  cancer, and alcohol abuse.   No previous pregnancy history, current pregnancy complicated by  oligohydramnios and post-date status.   PHYSICAL EXAMINATION:  GENERAL:  She is a well-developed, well-nourished  white female in no acute distress.  HEENT:  Normal.  LUNGS:  Clear.  HEART:  Regular rhythm.  ABDOMEN:  Soft, gravid, and nontender.  Estimated fetal weight 6 pounds.  Cervix is fingertip, 60% vertex, -1.  EXTREMITIES:  There are no cords.  NEUROLOGIC:  Nonfocal.  SKIN:  Intact.   NSTs reactive.  Cervidil is placed.   IMPRESSION:  1. Postdates Pregnancy.  2. New-onset mild oligohydramnios.   PLAN:  To proceed with cervical ripening, induction, Pitocin in the  a.m., and anticipated attempts at vaginal delivery.      Lenoard Aden, M.D.  Electronically Signed     RJT/MEDQ  D:  04/29/2009  T:  04/30/2009  Job:  [id number]

## 2011-08-27 ENCOUNTER — Encounter: Payer: Self-pay | Admitting: Family Medicine

## 2011-08-27 ENCOUNTER — Inpatient Hospital Stay (INDEPENDENT_AMBULATORY_CARE_PROVIDER_SITE_OTHER)
Admission: RE | Admit: 2011-08-27 | Discharge: 2011-08-27 | Disposition: A | Discharge status: Home / Self Care | Payer: BC Managed Care – PPO | Source: Ambulatory Visit | Attending: Family Medicine | Admitting: Family Medicine

## 2011-08-27 ENCOUNTER — Other Ambulatory Visit: Payer: Self-pay | Admitting: Family Medicine

## 2011-08-27 DIAGNOSIS — F411 Generalized anxiety disorder: Secondary | ICD-10-CM | POA: Insufficient documentation

## 2011-08-27 DIAGNOSIS — J069 Acute upper respiratory infection, unspecified: Secondary | ICD-10-CM

## 2011-08-27 DIAGNOSIS — J029 Acute pharyngitis, unspecified: Secondary | ICD-10-CM

## 2011-08-27 LAB — CONVERTED CEMR LAB
Heterophile Ab Screen: NEGATIVE
Rapid Strep: NEGATIVE

## 2011-08-29 LAB — CULTURE, GROUP A STREP: Organism ID, Bacteria: NORMAL

## 2011-09-02 ENCOUNTER — Telehealth (INDEPENDENT_AMBULATORY_CARE_PROVIDER_SITE_OTHER): Payer: Self-pay | Admitting: *Deleted

## 2011-09-28 NOTE — Telephone Encounter (Signed)
  Phone Note Outgoing Call Call back at [facility] Phone [phone]   Call placed by: Lajean Saver RN,  September 02, 2011 4:39 PM Call placed to: Patient Summary of Call: Callback: No answer. Message left to call with questions or concerns.

## 2011-09-28 NOTE — Progress Notes (Signed)
Summary: possible mono/wb (room 4)   Vital Signs:  Patient Profile:   35 Years Old Female CC:      increasing fatigue for 4-5 days; sore throat and cervical nodes today Height:     65 inches Weight:      132 pounds O2 Sat:      97 % O2 treatment:    Room Air Temp:     98.8 degrees F oral Pulse rate:   57 / minute Resp:     16 per minute BP sitting:   118 / 77  (left arm) Cuff size:   regular  Pt. in pain?   yes    Location:   throat/lymph nodes  Vitals Entered By: Lavell Islam RN (August 27, 2011 4:11 PM)              Comments Had negative rapid Strep at work today (no 48 culture ordered); mother-in -law hospitalized recently and dx Mono (severe). Had Flu shot approximately 1 month ago.      Updated Prior Medication List: CELEXA 10 MG TABS (CITALOPRAM HYDROBROMIDE) 1 tab by mouth once daily BUSPIRONE HCL 5 MG TABS (BUSPIRONE HCL) 1 tab by mouth once daily  Current Allergies: No known allergies History of Present Illness Chief Complaint: increasing fatigue for 4-5 days; sore throat and cervical nodes today History of Present Illness:  Subjective: Patient complains of fatigue for several days, and yesterday developed headache.  She is concerned that she may have mono + mild sore throat today No cough No pleuritic pain No wheezing + nasal congestion today ? post-nasal drainage No sinus pain/pressure No itchy/red eyes No earache No hemoptysis No SOB No fever/chills but has felt hot No nausea No vomiting ? mild abdominal pain No diarrhea No skin rashes No myalgias    REVIEW OF SYSTEMS Constitutional Symptoms       Complains of fatigue.     Denies fever, chills, night sweats, weight loss, and weight gain.  Eyes       Denies change in vision, eye pain, eye discharge, glasses, contact lenses, and eye surgery. Ear/Nose/Throat/Mouth       Complains of sore throat.      Denies hearing loss/aids, change in hearing, ear pain, ear discharge, dizziness,  frequent runny nose, frequent nose bleeds, sinus problems, hoarseness, and tooth pain or bleeding.  Respiratory       Denies dry cough, productive cough, wheezing, shortness of breath, asthma, bronchitis, and emphysema/COPD.  Cardiovascular       Denies murmurs, chest pain, and tires easily with exhertion.    Gastrointestinal       Denies stomach pain, nausea/vomiting, diarrhea, constipation, blood in bowel movements, and indigestion. Genitourniary       Denies painful urination, kidney stones, and loss of urinary control. Neurological       Complains of headaches.      Denies paralysis, seizures, and fainting/blackouts. Musculoskeletal       Denies muscle pain, joint pain, joint stiffness, decreased range of motion, redness, swelling, muscle weakness, and gout.  Skin       Complains of unusual moles/lumps or sores.      Denies bruising and hair/skin or nail changes.      Comments: nodes enlarged Psych       Denies mood changes, temper/anger issues, anxiety/stress, speech problems, depression, and sleep problems. Other Comments: Fatigue x 4-5 days; sore throat and swollen lymph nodes today   Past History:  Past Medical History: Acid Reflux  Anxiety  Past Surgical History: Reviewed history from 09/08/2010 and no changes required. Caesarean section 04/2009  Family History: Reviewed history from 01/02/2010 and no changes required. Family History Breast cancer 1st degree relative <50 Family History of Colon CA 1st degree relative <60 Family History of Melanoma Family History Other cancer Family History of Cardiovascular disorder  Social History: Reviewed history from 01/02/2010 and no changes required. Married Never Smoked Alcohol use-yes - wine a couple times weekly Drug use-no Regular exercise-yes   Objective:  Appearance:  Patient appears healthy, stated age, and in no acute distress  Eyes:  Pupils are equal, round, and reactive to light and accomodation.  Extraocular  movement is intact.  Conjunctivae are not inflamed.  Ears:  Canals normal.  Tympanic membranes normal.   Nose:  Mildly congested turbinates.  No sinus tenderness  Pharynx: Minimal erythema Neck:  Supple.  Slightly tender shotty posterior nodes are palpated bilaterally.  Lungs:  Clear to auscultation.  Breath sounds are equal.  Heart:  Regular rate and rhythm without murmurs, rubs, or gallops.  Abdomen:  Mild tenderness over spleen and liver without masses or hepatosplenomegaly.  Bowel sounds are present.  No CVA or flank tenderness.  Rapid strep test negative  Monospot negative CBC:  WBC 7.1 ; LY 43.8, MO 6.8, GR 49.4; Hgb 13.0   Assessment New Problems: UPPER RESPIRATORY INFECTION, ACUTE (ICD-465.9) ACUTE PHARYNGITIS (ICD-462) ANXIETY (ICD-300.00)  NO EVIDENCE BACTERIAL INFECTION TODAY; SUSPECT A VIRAL URI RATHER THAN MONO  Plan New Medications/Changes: AZITHROMYCIN 250 MG TABS (AZITHROMYCIN) Two tabs by mouth on day 1, then 1 tab daily on days 2 through 5 (Rx void after 09/03/11)  #6 tabs x 0, 08/27/2011, Donna Christen MD BENZONATATE 200 MG CAPS (BENZONATATE) One by mouth hs as needed cough  #12 x 0, 08/27/2011, Donna Christen MD  New Orders: Rapid Strep [09811] CBC w/Diff [91478-29562] Monospot [86308] T-Culture, Throat [[id number]] Est. Patient Level IV [96295] Planning Comments:   Throat culture pending Treat symptomatically for now:  Increase fluid intake, begin expectorant/decongestant, topical decongestant,  cough suppressant at bedtime.  If fever/chills/sweats persist, or if not improving 5  days begin Z-pack (given Rx to hold).  Followup with PCP if not improving 7 to 10 days.   The patient and/or caregiver has been counseled thoroughly with regard to medications prescribed including dosage, schedule, interactions, rationale for use, and possible side effects and they verbalize understanding.  Diagnoses and expected course of recovery discussed and will return if not  improved as expected or if the condition worsens. Patient and/or caregiver verbalized understanding.  Prescriptions: AZITHROMYCIN 250 MG TABS (AZITHROMYCIN) Two tabs by mouth on day 1, then 1 tab daily on days 2 through 5 (Rx void after 09/03/11)  #6 tabs x 0   Entered and Authorized by:   Donna Christen MD   Signed by:   Donna Christen MD on 08/27/2011   Method used:   Print then Give to Patient   RxID:   [id number] BENZONATATE 200 MG CAPS (BENZONATATE) One by mouth hs as needed cough  #12 x 0   Entered and Authorized by:   Donna Christen MD   Signed by:   Donna Christen MD on 08/27/2011   Method used:   Print then Give to Patient   RxID:   [id number]   Patient Instructions: 1)  Take Mucinex D (guaifenesin with decongestant) twice daily for congestion. 2)  Increase fluid intake, rest. 3)  May take Ibuprofen 200mg , 3 or 4 tabs every  8 hours with food for sore throat, headache, body aches, etc. 4)  May use Afrin nasal spray (or generic oxymetazoline) twice daily for about 5 days.  Also recommend using saline nasal spray several times daily and/or saline nasal irrigation. 5)  Begin Azithromycin if not improving about 5 days or if persistent fever develops. 6)  Followup with family doctor if not improving 7 to 10 days.   Orders Added: 1)  Rapid Strep [87880] 2)  CBC w/Diff [85025-10010] 3)  Monospot [86308] 4)  T-Culture, Throat [[id number]] 5)  Est. Patient Level IV [09811]    Laboratory Results   Blood Tests   Date/Time Received: August 27, 2011 4:47 PM  Date/Time Reported: August 27, 2011 4:47 PM    Mono: negative Comments: mother-in-law recently had Mono (severe) Date/Time Received: August 27, 2011 4:46 PM  Date/Time Reported: August 27, 2011 4:46 PM   Other Tests  Rapid Strep: negative  Kit Test Internal QC: Negative   (Normal Range: Negative)

## 2012-03-02 ENCOUNTER — Emergency Department
Admission: EM | Admit: 2012-03-02 | Discharge: 2012-03-02 | Disposition: A | Discharge status: Home / Self Care | Payer: BC Managed Care – PPO | Source: Home / Self Care | Attending: Emergency Medicine | Admitting: Emergency Medicine

## 2012-03-02 ENCOUNTER — Encounter: Payer: Self-pay | Admitting: *Deleted

## 2012-03-02 DIAGNOSIS — J329 Chronic sinusitis, unspecified: Secondary | ICD-10-CM

## 2012-03-02 DIAGNOSIS — J069 Acute upper respiratory infection, unspecified: Secondary | ICD-10-CM

## 2012-03-02 HISTORY — DX: Postpartum depression: F53.0

## 2012-03-02 HISTORY — DX: Gastro-esophageal reflux disease without esophagitis: K21.9

## 2012-03-02 HISTORY — DX: Other mental disorders complicating the puerperium: O99.345

## 2012-03-02 MED ORDER — AZITHROMYCIN 250 MG PO TABS: ORAL_TABLET | ORAL | Status: AC

## 2012-03-02 NOTE — ED Provider Notes (Signed)
History     CSN: [id number]  Arrival date & time 03/02/12  1045   First MD Initiated Contact with Patient 03/02/12 1052      Chief Complaint  Patient presents with  . Nasal Congestion    (Consider location/radiation/quality/duration/timing/severity/associated sxs/prior treatment) HPI Sandra Roberson is a 36 y.o. female who complains of onset of cold symptoms for 4-5 days.  The symptoms are constant and mild-moderate in severity. + sore throat + cough + hoarseness No pleuritic pain No wheezing + nasal congestion + post-nasal drainage ++ sinus pain/pressure (main symptom) No chest congestion No itchy/red eyes + mild earache No hemoptysis No SOB No chills/sweats No fever No nausea No vomiting No abdominal pain No diarrhea No skin rashes No fatigue No myalgias + headache    No past medical history on file.  No past surgical history on file.  No family history on file.  History  Substance Use Topics  . Smoking status: Not on file  . Smokeless tobacco: Not on file  . Alcohol Use: Not on file    OB History    No data available      Review of Systems  All other systems reviewed and are negative.    Allergies  Review of patient's allergies indicates no known allergies.  Home Medications   Current Outpatient Rx  Name Route Sig Dispense Refill  . BUSPIRONE HCL 10 MG PO TABS Oral Take 10 mg by mouth 3 (three) times daily.    . DEXLANSOPRAZOLE 30 MG PO CPDR Oral Take 30 mg by mouth daily.    Marland Kitchen FLUOXETINE HCL 20 MG PO CAPS Oral Take 20 mg by mouth daily.    . AZITHROMYCIN 250 MG PO TABS  Use as directed 1 each 0    BP 102/61  Pulse 69  Temp(Src) 98.9 F (37.2 C) (Oral)  Resp 16  Ht 5\' 5"  (1.651 m)  Wt 135 lb 8 oz (61.462 kg)  BMI 22.55 kg/m2  SpO2 100%  Physical Exam  Nursing note and vitals reviewed. Constitutional: She is oriented to person, place, and time. She appears well-developed and well-nourished.  HENT:  Head: Normocephalic and  atraumatic.  Right Ear: Tympanic membrane, external ear and ear canal normal.  Left Ear: Tympanic membrane, external ear and ear canal normal.  Nose: Mucosal edema and rhinorrhea present. Right sinus exhibits maxillary sinus tenderness. Left sinus exhibits maxillary sinus tenderness.  Mouth/Throat: Posterior oropharyngeal erythema (post nasal drip - mild) present. No oropharyngeal exudate or posterior oropharyngeal edema.  Eyes: No scleral icterus.  Neck: Neck supple.  Cardiovascular: Regular rhythm and normal heart sounds.   Pulmonary/Chest: Effort normal and breath sounds normal. No respiratory distress.  Neurological: She is alert and oriented to person, place, and time.  Skin: Skin is warm and dry.  Psychiatric: She has a normal mood and affect. Her speech is normal.    ED Course  Procedures (including critical care time)  Labs Reviewed - No data to display No results found.   1. Sinusitis   2. Acute upper respiratory infections of unspecified site       MDM  1)  Take the prescribed antibiotic as instructed. 2)  Use nasal saline solution (over the counter) at least 3 times a day. 3)  Use over the counter decongestants like Zyrtec-D every 12 hours as needed to help with congestion.  If you have hypertension, do not take medicines with sudafed.  4)  Can take tylenol every 6 hours or motrin every 8  hours for pain or fever. 5)  Follow up with your primary doctor if no improvement in 5-7 days, sooner if increasing pain, fever, or new symptoms.        Marlaine Hind, MD 03/02/12 1106

## 2012-03-02 NOTE — ED Notes (Signed)
Pt c/o nasal congestion, chills, hoarseness, and sinus pain x 3 days. Denies fever. She has taken mucinex, sudafed and tylenol with no relief.

## 2012-10-14 ENCOUNTER — Ambulatory Visit: Payer: BC Managed Care – PPO | Admitting: Licensed Clinical Social Worker

## 2013-10-26 HISTORY — PX: LIVER RESECTION: SHX1977

## 2014-03-07 ENCOUNTER — Other Ambulatory Visit: Payer: Self-pay | Admitting: Dermatology

## 2014-05-15 ENCOUNTER — Other Ambulatory Visit: Payer: Self-pay | Admitting: Dermatology

## 2014-07-23 ENCOUNTER — Ambulatory Visit (INDEPENDENT_AMBULATORY_CARE_PROVIDER_SITE_OTHER): Payer: BC Managed Care – PPO | Admitting: Licensed Clinical Social Worker

## 2014-07-23 DIAGNOSIS — F331 Major depressive disorder, recurrent, moderate: Secondary | ICD-10-CM

## 2014-08-03 ENCOUNTER — Ambulatory Visit (INDEPENDENT_AMBULATORY_CARE_PROVIDER_SITE_OTHER): Payer: BC Managed Care – PPO | Admitting: Licensed Clinical Social Worker

## 2014-08-03 DIAGNOSIS — F332 Major depressive disorder, recurrent severe without psychotic features: Secondary | ICD-10-CM

## 2014-08-08 ENCOUNTER — Ambulatory Visit (INDEPENDENT_AMBULATORY_CARE_PROVIDER_SITE_OTHER): Payer: BC Managed Care – PPO | Admitting: Licensed Clinical Social Worker

## 2014-08-08 DIAGNOSIS — F332 Major depressive disorder, recurrent severe without psychotic features: Secondary | ICD-10-CM

## 2014-08-20 ENCOUNTER — Ambulatory Visit: Payer: BC Managed Care – PPO | Admitting: Licensed Clinical Social Worker

## 2015-08-12 ENCOUNTER — Encounter (HOSPITAL_COMMUNITY): Payer: Self-pay | Admitting: Emergency Medicine

## 2015-08-12 ENCOUNTER — Emergency Department (INDEPENDENT_AMBULATORY_CARE_PROVIDER_SITE_OTHER)
Admission: EM | Admit: 2015-08-12 | Discharge: 2015-08-12 | Disposition: A | Discharge status: Home / Self Care | Payer: BLUE CROSS/BLUE SHIELD | Source: Home / Self Care | Attending: Family Medicine | Admitting: Family Medicine

## 2015-08-12 DIAGNOSIS — J0101 Acute recurrent maxillary sinusitis: Secondary | ICD-10-CM

## 2015-08-12 MED ORDER — IPRATROPIUM BROMIDE 0.06 % NA SOLN: 2.0000 | Freq: Four times a day (QID) | NASAL | Status: DC

## 2015-08-12 MED ORDER — DOXYCYCLINE HYCLATE 100 MG PO CAPS: 100.0000 mg | ORAL_CAPSULE | Freq: Two times a day (BID) | ORAL | Status: DC

## 2015-08-12 NOTE — ED Provider Notes (Signed)
CSN: [id number]     Arrival date & time 08/12/15  1303 History   First MD Initiated Contact with Patient 08/12/15 1338     Chief Complaint  Patient presents with  . Sinusitis   (Consider location/radiation/quality/duration/timing/severity/associated sxs/prior Treatment) Patient is a 39 y.o. female presenting with sinusitis. The history is provided by the patient.  Sinusitis Pain details:    Location:  Maxillary   Quality:  Dull and pressure   Severity:  Mild   Duration:  4 days Progression:  Worsening Context: recent URI   Ineffective treatments:  Saline sprays and oral decongestants Associated symptoms: cough, ear pain, fever and rhinorrhea     Past Medical History  Diagnosis Date  . Postpartum depression   . GERD (gastroesophageal reflux disease)    Past Surgical History  Procedure Laterality Date  . Cesarean section     Family History  Problem Relation Age of Onset  . GER disease Father    Social History  Substance Use Topics  . Smoking status: Never Smoker   . Smokeless tobacco: None  . Alcohol Use: Yes   OB History    No data available     Review of Systems  Constitutional: Positive for fever.  HENT: Positive for ear pain, postnasal drip and rhinorrhea.   Respiratory: Positive for cough.     Allergies  Review of patient's allergies indicates no known allergies.  Home Medications   Prior to Admission medications   Medication Sig Start Date End Date Taking? Authorizing Provider  busPIRone (BUSPAR) 10 MG tablet Take 10 mg by mouth 3 (three) times daily.    Historical Provider, MD  Dexlansoprazole (DEXILANT) 30 MG capsule Take 30 mg by mouth daily.    Historical Provider, MD  doxycycline (VIBRAMYCIN) 100 MG capsule Take 1 capsule (100 mg total) by mouth 2 (two) times daily. 08/12/15   Billy Fischer, MD  FLUoxetine (PROZAC) 20 MG capsule Take 20 mg by mouth daily.    Historical Provider, MD  ipratropium (ATROVENT) 0.06 % nasal spray Place 2 sprays into  the nose 4 (four) times daily. 08/12/15   Billy Fischer, MD   Meds Ordered and Administered this Visit  Medications - No data to display  BP 113/76 mmHg  Pulse 57  Temp(Src) 97.8 F (36.6 C) (Oral)  Resp 18  SpO2 100% No data found.   Physical Exam  Constitutional: She is oriented to person, place, and time. She appears well-developed and well-nourished. No distress.  HENT:  Right Ear: External ear normal.  Left Ear: External ear normal.  Mouth/Throat: Oropharynx is clear and moist.  Eyes: Pupils are equal, round, and reactive to light.  Neck: Normal range of motion. Neck supple.  Pulmonary/Chest: Effort normal and breath sounds normal.  Abdominal: Soft. Bowel sounds are normal.  Neurological: She is alert and oriented to person, place, and time.  Skin: Skin is warm and dry.  Nursing note and vitals reviewed.   ED Course  Procedures (including critical care time)  Labs Review Labs Reviewed - No data to display  Imaging Review No results found.   Visual Acuity Review  Right Eye Distance:   Left Eye Distance:   Bilateral Distance:    Right Eye Near:   Left Eye Near:    Bilateral Near:         MDM   1. Acute recurrent maxillary sinusitis        Billy Fischer, MD 08/12/15 1401

## 2015-08-12 NOTE — ED Notes (Signed)
Pt here with sinus infection that started last Friday. Post nasal drip, cough with facial pain and pressure behind eyes Denies fever,v,n Chills noted

## 2015-08-26 ENCOUNTER — Ambulatory Visit (INDEPENDENT_AMBULATORY_CARE_PROVIDER_SITE_OTHER): Payer: BLUE CROSS/BLUE SHIELD | Admitting: Licensed Clinical Social Worker

## 2015-08-26 DIAGNOSIS — F332 Major depressive disorder, recurrent severe without psychotic features: Secondary | ICD-10-CM | POA: Diagnosis not present

## 2015-09-05 ENCOUNTER — Ambulatory Visit (INDEPENDENT_AMBULATORY_CARE_PROVIDER_SITE_OTHER): Payer: BLUE CROSS/BLUE SHIELD | Admitting: Licensed Clinical Social Worker

## 2015-09-05 DIAGNOSIS — F332 Major depressive disorder, recurrent severe without psychotic features: Secondary | ICD-10-CM

## 2015-09-25 ENCOUNTER — Ambulatory Visit (INDEPENDENT_AMBULATORY_CARE_PROVIDER_SITE_OTHER): Payer: BLUE CROSS/BLUE SHIELD | Admitting: Licensed Clinical Social Worker

## 2015-09-25 DIAGNOSIS — F332 Major depressive disorder, recurrent severe without psychotic features: Secondary | ICD-10-CM

## 2015-10-09 ENCOUNTER — Ambulatory Visit (INDEPENDENT_AMBULATORY_CARE_PROVIDER_SITE_OTHER): Payer: BLUE CROSS/BLUE SHIELD | Admitting: Licensed Clinical Social Worker

## 2015-10-09 DIAGNOSIS — F332 Major depressive disorder, recurrent severe without psychotic features: Secondary | ICD-10-CM | POA: Diagnosis not present

## 2015-10-25 ENCOUNTER — Ambulatory Visit: Payer: BLUE CROSS/BLUE SHIELD | Admitting: Licensed Clinical Social Worker

## 2016-05-26 ENCOUNTER — Ambulatory Visit (INDEPENDENT_AMBULATORY_CARE_PROVIDER_SITE_OTHER): Payer: BLUE CROSS/BLUE SHIELD | Admitting: Licensed Clinical Social Worker

## 2016-05-26 DIAGNOSIS — F419 Anxiety disorder, unspecified: Secondary | ICD-10-CM | POA: Diagnosis not present

## 2016-09-30 ENCOUNTER — Other Ambulatory Visit: Payer: Self-pay | Admitting: Obstetrics and Gynecology

## 2016-09-30 DIAGNOSIS — R928 Other abnormal and inconclusive findings on diagnostic imaging of breast: Secondary | ICD-10-CM

## 2016-10-02 ENCOUNTER — Ambulatory Visit
Admission: RE | Admit: 2016-10-02 | Discharge: 2016-10-02 | Disposition: A | Discharge status: Home / Self Care | Payer: BLUE CROSS/BLUE SHIELD | Source: Ambulatory Visit | Attending: Obstetrics and Gynecology | Admitting: Obstetrics and Gynecology

## 2016-10-02 DIAGNOSIS — R928 Other abnormal and inconclusive findings on diagnostic imaging of breast: Secondary | ICD-10-CM

## 2016-10-07 ENCOUNTER — Other Ambulatory Visit: Payer: Self-pay

## 2017-11-23 ENCOUNTER — Other Ambulatory Visit: Payer: Self-pay | Admitting: Obstetrics and Gynecology

## 2017-11-23 DIAGNOSIS — Z139 Encounter for screening, unspecified: Secondary | ICD-10-CM

## 2017-11-25 ENCOUNTER — Ambulatory Visit: Payer: Self-pay

## 2018-08-31 ENCOUNTER — Ambulatory Visit
Admission: RE | Admit: 2018-08-31 | Discharge: 2018-08-31 | Disposition: A | Discharge status: Home / Self Care | Payer: BLUE CROSS/BLUE SHIELD | Source: Ambulatory Visit | Attending: Obstetrics and Gynecology | Admitting: Obstetrics and Gynecology

## 2018-08-31 ENCOUNTER — Other Ambulatory Visit: Payer: Self-pay | Admitting: Obstetrics and Gynecology

## 2018-08-31 DIAGNOSIS — N644 Mastodynia: Secondary | ICD-10-CM

## 2019-10-19 DIAGNOSIS — J329 Chronic sinusitis, unspecified: Secondary | ICD-10-CM | POA: Diagnosis not present

## 2019-10-19 DIAGNOSIS — R0981 Nasal congestion: Secondary | ICD-10-CM | POA: Diagnosis not present

## 2019-10-26 DIAGNOSIS — Z7289 Other problems related to lifestyle: Secondary | ICD-10-CM | POA: Diagnosis not present

## 2019-10-26 DIAGNOSIS — J3089 Other allergic rhinitis: Secondary | ICD-10-CM | POA: Diagnosis not present

## 2019-10-26 DIAGNOSIS — J324 Chronic pansinusitis: Secondary | ICD-10-CM | POA: Diagnosis not present

## 2019-10-31 DIAGNOSIS — J324 Chronic pansinusitis: Secondary | ICD-10-CM | POA: Diagnosis not present

## 2019-10-31 DIAGNOSIS — Z03818 Encounter for observation for suspected exposure to other biological agents ruled out: Secondary | ICD-10-CM | POA: Diagnosis not present

## 2019-10-31 DIAGNOSIS — J069 Acute upper respiratory infection, unspecified: Secondary | ICD-10-CM | POA: Diagnosis not present

## 2019-11-08 DIAGNOSIS — J324 Chronic pansinusitis: Secondary | ICD-10-CM | POA: Diagnosis not present

## 2019-11-08 DIAGNOSIS — J019 Acute sinusitis, unspecified: Secondary | ICD-10-CM | POA: Diagnosis not present

## 2019-11-08 DIAGNOSIS — J3089 Other allergic rhinitis: Secondary | ICD-10-CM | POA: Diagnosis not present

## 2019-11-08 DIAGNOSIS — Z0000 Encounter for general adult medical examination without abnormal findings: Secondary | ICD-10-CM | POA: Diagnosis not present

## 2019-11-08 DIAGNOSIS — S0300XD Dislocation of jaw, unspecified side, subsequent encounter: Secondary | ICD-10-CM | POA: Diagnosis not present

## 2019-11-21 DIAGNOSIS — Z03818 Encounter for observation for suspected exposure to other biological agents ruled out: Secondary | ICD-10-CM | POA: Diagnosis not present

## 2019-11-21 DIAGNOSIS — R05 Cough: Secondary | ICD-10-CM | POA: Diagnosis not present

## 2019-11-21 DIAGNOSIS — R5383 Other fatigue: Secondary | ICD-10-CM | POA: Diagnosis not present

## 2019-11-23 DIAGNOSIS — R5383 Other fatigue: Secondary | ICD-10-CM | POA: Diagnosis not present

## 2019-11-23 DIAGNOSIS — R05 Cough: Secondary | ICD-10-CM | POA: Diagnosis not present

## 2019-11-24 ENCOUNTER — Ambulatory Visit: Payer: BC Managed Care – PPO | Attending: Internal Medicine

## 2019-11-24 DIAGNOSIS — Z20822 Contact with and (suspected) exposure to covid-19: Secondary | ICD-10-CM

## 2019-11-25 LAB — NOVEL CORONAVIRUS, NAA: SARS-CoV-2, NAA: NOT DETECTED

## 2019-11-27 ENCOUNTER — Ambulatory Visit: Payer: BC Managed Care – PPO | Attending: Internal Medicine

## 2019-11-27 DIAGNOSIS — Z20822 Contact with and (suspected) exposure to covid-19: Secondary | ICD-10-CM

## 2019-11-28 DIAGNOSIS — R05 Cough: Secondary | ICD-10-CM | POA: Diagnosis not present

## 2019-11-28 DIAGNOSIS — J209 Acute bronchitis, unspecified: Secondary | ICD-10-CM | POA: Diagnosis not present

## 2019-11-28 LAB — NOVEL CORONAVIRUS, NAA: SARS-CoV-2, NAA: NOT DETECTED

## 2019-11-29 DIAGNOSIS — J209 Acute bronchitis, unspecified: Secondary | ICD-10-CM | POA: Diagnosis not present

## 2019-11-29 DIAGNOSIS — J324 Chronic pansinusitis: Secondary | ICD-10-CM | POA: Diagnosis not present

## 2019-12-01 ENCOUNTER — Ambulatory Visit: Payer: Self-pay | Admitting: Allergy

## 2019-12-19 DIAGNOSIS — R05 Cough: Secondary | ICD-10-CM | POA: Diagnosis not present

## 2019-12-20 DIAGNOSIS — R05 Cough: Secondary | ICD-10-CM | POA: Diagnosis not present

## 2019-12-20 DIAGNOSIS — J069 Acute upper respiratory infection, unspecified: Secondary | ICD-10-CM | POA: Diagnosis not present

## 2019-12-23 DIAGNOSIS — Z23 Encounter for immunization: Secondary | ICD-10-CM | POA: Diagnosis not present

## 2019-12-23 DIAGNOSIS — J0101 Acute recurrent maxillary sinusitis: Secondary | ICD-10-CM | POA: Diagnosis not present

## 2019-12-26 ENCOUNTER — Encounter: Payer: Self-pay | Admitting: Critical Care Medicine

## 2019-12-29 ENCOUNTER — Ambulatory Visit: Payer: Self-pay | Admitting: Allergy

## 2020-01-05 ENCOUNTER — Other Ambulatory Visit: Payer: Self-pay

## 2020-01-05 ENCOUNTER — Encounter: Payer: Self-pay | Admitting: Critical Care Medicine

## 2020-01-05 ENCOUNTER — Ambulatory Visit: Payer: BC Managed Care – PPO | Admitting: Critical Care Medicine

## 2020-01-05 VITALS — BP 130/80 | HR 65 | Temp 98.2°F | Ht 65.0 in | Wt 144.4 lb

## 2020-01-05 DIAGNOSIS — J4540 Moderate persistent asthma, uncomplicated: Secondary | ICD-10-CM | POA: Diagnosis not present

## 2020-01-05 DIAGNOSIS — K219 Gastro-esophageal reflux disease without esophagitis: Secondary | ICD-10-CM

## 2020-01-05 DIAGNOSIS — J309 Allergic rhinitis, unspecified: Secondary | ICD-10-CM | POA: Diagnosis not present

## 2020-01-05 LAB — CBC WITH DIFFERENTIAL/PLATELET
Basophils Absolute: 0 10*3/uL (ref 0.0–0.1)
Basophils Relative: 0.7 % (ref 0.0–3.0)
Eosinophils Absolute: 0.1 10*3/uL (ref 0.0–0.7)
Eosinophils Relative: 1.9 % (ref 0.0–5.0)
HCT: 35.6 % — ABNORMAL LOW (ref 36.0–46.0)
Hemoglobin: 12.1 g/dL (ref 12.0–15.0)
Lymphocytes Relative: 33.7 % (ref 12.0–46.0)
Lymphs Abs: 2 10*3/uL (ref 0.7–4.0)
MCHC: 34 g/dL (ref 30.0–36.0)
MCV: 97.2 fl (ref 78.0–100.0)
Monocytes Absolute: 0.3 10*3/uL (ref 0.1–1.0)
Monocytes Relative: 5.7 % (ref 3.0–12.0)
Neutro Abs: 3.5 10*3/uL (ref 1.4–7.7)
Neutrophils Relative %: 58 % (ref 43.0–77.0)
Platelets: 335 10*3/uL (ref 150.0–400.0)
RBC: 3.67 Mil/uL — ABNORMAL LOW (ref 3.87–5.11)
RDW: 13.3 % (ref 11.5–15.5)
WBC: 6 10*3/uL (ref 4.0–10.5)

## 2020-01-05 MED ORDER — FLUTICASONE-SALMETEROL 500-50 MCG/DOSE IN AEPB: 1.0000 | INHALATION_SPRAY | Freq: Two times a day (BID) | RESPIRATORY_TRACT | 5 refills | Status: DC

## 2020-01-05 NOTE — Progress Notes (Addendum)
Synopsis: Referred in March 2021 for cough by Chesley Noon, MD  Subjective:   PATIENT ID: Sandra Roberson GENDER: female DOB: 11-29-1975, MRN: [id number]  Chief Complaint  Patient presents with  . Consult    Patient had a sinus infection that led to being treated for bronchitits. Patient has had 7 covid tests and all were negative. Patient has a dry cough since and feels like she can't get a full breath. Patient was given Advair and has noticed some improvement. Patient was running 3 miles a day before all this but has not been able to since then.    Sandra Roberson is a 44 y/o woman referred for evaluation of SOB that has been ongoing for several months.  Her symptoms began when she had a prolonged sinus infection in the fall of 2020 that took multiple doses of antibiotics to resolve.  Prior to that she was running 3 miles per day, but had progressive shortness of breath, cough, sinus pressure and congestion.  She had another sinus infection in early February when she was very sick for about a week with loss of taste and smell, chest pressure, headaches, sinus symptoms, cough, fatigue.  She developed shortness of breath after this.  She was treated with steroids, which had to be stopped due to severe worsening of her GERD.  A 5-day course of antibiotics did not help at that time.  She was prescribed Advair 250 with some benefit, and she has since backed herself back down to once per day to see if it helps.  Albuterol has not helped much.  She continues to complain of cough, shortness of breath, and a sensation that she cannot take a deep breath.  She has a burning/tingling sensation in her chest.  She has no significant chest tightness.  She takes Singulair, Xyzal, Claritin for her allergies.  She has a referral to the allergist, but has been unable to stop her antihistamines to get the necessary allergy testing done.  She saw Dr. Janace Hoard from ENT at [facility] from 10/26/2019 reviewed.   He she was diagnosed with chronic pansinusitis.  She was continued on Singulair, antihistamines, nasal steroids, and he had a course of clindamycin.  She has been trying to increase her physical activity, but when she goes on walks she is only able to run for 1 to 2 minutes before having to switch back to walking due to dyspnea.  She had a CXR at [facility] on 12/19/2019 that was reported as normal.  There is no family history of asthma, but her sister gets frequent episodes of bronchitis.  Throughout her illness she has had 7 negative Covid tests and negative antibodies.  She has received her first dose of her Covid vaccine.     Past Medical History:  Diagnosis Date  . GERD (gastroesophageal reflux disease)   . Postpartum depression      Family History  Problem Relation Age of Onset  . GER disease Father      Past Surgical History:  Procedure Laterality Date  . CESAREAN SECTION      Social History   Socioeconomic History  . Marital status: Married    Spouse name: Not on file  . Number of children: Not on file  . Years of education: Not on file  . Highest education level: Not on file  Occupational History  . Not on file  Tobacco Use  . Smoking status: Never Smoker  Substance and Sexual Activity  . Alcohol  use: Yes  . Drug use: No  . Sexual activity: Not on file  Other Topics Concern  . Not on file  Social History Narrative  . Not on file   Social Determinants of Health   Financial Resource Strain:   . Difficulty of Paying Living Expenses:   Food Insecurity:   . Worried About Charity fundraiser in the Last Year:   . Arboriculturist in the Last Year:   Transportation Needs:   . Film/video editor (Medical):   Marland Kitchen Lack of Transportation (Non-Medical):   Physical Activity:   . Days of Exercise per Week:   . Minutes of Exercise per Session:   Stress:   . Feeling of Stress :   Social Connections:   . Frequency of Communication with Friends and Family:   . Frequency  of Social Gatherings with Friends and Family:   . Attends Religious Services:   . Active Member of Clubs or Organizations:   . Attends Archivist Meetings:   Marland Kitchen Marital Status:   Intimate Partner Violence:   . Fear of Current or Ex-Partner:   . Emotionally Abused:   Marland Kitchen Physically Abused:   . Sexually Abused:      No Known Allergies    There is no immunization history on file for this patient.  Outpatient Medications Prior to Visit  Medication Sig Dispense Refill  . busPIRone (BUSPAR) 10 MG tablet Take 10 mg by mouth 3 (three) times daily.    Marland Kitchen Dexlansoprazole (DEXILANT) 30 MG capsule Take 30 mg by mouth daily.    Marland Kitchen doxycycline (VIBRAMYCIN) 100 MG capsule Take 1 capsule (100 mg total) by mouth 2 (two) times daily. 20 capsule 0  . FLUoxetine (PROZAC) 20 MG capsule Take 20 mg by mouth daily.    Marland Kitchen ipratropium (ATROVENT) 0.06 % nasal spray Place 2 sprays into the nose 4 (four) times daily. 15 mL 2   No facility-administered medications prior to visit.    Review of Systems  Constitutional: Negative for chills and fever.  HENT: Positive for congestion.   Respiratory: Positive for cough, shortness of breath and wheezing. Negative for sputum production.   Cardiovascular: Negative for chest pain and leg swelling.  Gastrointestinal: Positive for heartburn.     Objective:   Vitals:   01/05/20 1214  BP: 130/80  Pulse: 65  Temp: 98.2 F (36.8 C)  TempSrc: Temporal  SpO2: 97%  Weight: 144 lb 6.4 oz (65.5 kg)  Height: 5\' 5"  (1.651 m)     on   RA BMI Readings from Last 3 Encounters:  03/02/12 22.55 kg/m  08/27/11 21.97 kg/m   Wt Readings from Last 3 Encounters:  03/02/12 135 lb 8 oz (61.5 kg)  08/27/11 132 lb (59.9 kg)    Physical Exam Vitals reviewed.  Constitutional:      Appearance: Normal appearance. She is not ill-appearing.  HENT:     Head: Normocephalic and atraumatic.  Eyes:     General: No scleral icterus. Cardiovascular:     Rate and Rhythm:  Normal rate and regular rhythm.     Heart sounds: No murmur.  Pulmonary:     Comments: Breathing comfortably on room air, no conversational dyspnea.  Prolonged exhalation, clear to auscultation bilaterally. Abdominal:     General: There is no distension.     Palpations: Abdomen is soft.  Musculoskeletal:        General: No swelling or deformity.     Cervical back: Neck  supple.  Skin:    General: Skin is warm and dry.     Findings: No rash.  Neurological:     General: No focal deficit present.     Mental Status: She is alert.     Coordination: Coordination normal.  Psychiatric:        Mood and Affect: Mood normal.        Behavior: Behavior normal.      CBC    Component Value Date/Time   WBC 8.7 05/01/2009 0530   RBC 3.33 (L) 05/01/2009 0530   HGB 11.1 (L) 05/01/2009 0530   HCT 32.0 (L) 05/01/2009 0530   PLT 235 05/01/2009 0530   MCV 96.1 05/01/2009 0530   MCHC 34.6 05/01/2009 0530   RDW 12.7 05/01/2009 0530    CHEMISTRY No results for input(s): NA, K, CL, CO2, GLUCOSE, BUN, CREATININE, CALCIUM, MG, PHOS in the last 168 hours. CrCl cannot be calculated (No successful lab value found.).   Chest Imaging- films reviewed: None available  Pulmonary Functions Testing Results: No flowsheet data found.      Assessment & Plan:     ICD-10-CM   1. Moderate persistent asthma without complication  123456 Pulmonary function test    CBC w/Diff    IgE    IgE    CBC w/Diff  2. Allergic sinusitis  J30.9 Pulmonary function test    CBC w/Diff    IgE    IgE    CBC w/Diff  3. Gastroesophageal reflux disease, unspecified whether esophagitis present  K21.9     Chronic cough, suspect moderate persistent asthma.  -CBC with differential, IgE -Increase Advair to 500-50 twice daily.  Instructed to use albuterol 5 to 10 minutes prior to increase her ability to do the necessary breath-hold. -Continue albuterol every 4 hours as needed -Continue allergic rhinosinusitis  management -PFTs -Agree with completing Covid vaccine series.  Allergic rhinosinusitis -Continue antihistamines, Flonase, Singulair  GERD -Continue PPI   RTC in 2 months after PFTs.   Current Outpatient Medications:  .  busPIRone (BUSPAR) 10 MG tablet, Take 10 mg by mouth 3 (three) times daily., Disp: , Rfl:  .  Dexlansoprazole (DEXILANT) 30 MG capsule, Take 30 mg by mouth daily., Disp: , Rfl:  .  doxycycline (VIBRAMYCIN) 100 MG capsule, Take 1 capsule (100 mg total) by mouth 2 (two) times daily., Disp: 20 capsule, Rfl: 0 .  FLUoxetine (PROZAC) 20 MG capsule, Take 20 mg by mouth daily., Disp: , Rfl:  .  ipratropium (ATROVENT) 0.06 % nasal spray, Place 2 sprays into the nose 4 (four) times daily., Disp: 15 mL, Rfl: 2    Julian Hy, DO [facility] 01/05/2020 8:45 AM

## 2020-01-05 NOTE — Patient Instructions (Signed)
Thank you for visiting Dr. Carlis Abbott at [facility]. We recommend the following: Orders Placed This Encounter  Procedures  . CBC w/Diff  . IgE  . Pulmonary function test   Orders Placed This Encounter  Procedures  . CBC w/Diff    Standing Status:   Future    Standing Expiration Date:   01/04/2021  . IgE    Standing Status:   Future    Standing Expiration Date:   01/04/2021  . Pulmonary function test    Standing Status:   Future    Standing Expiration Date:   01/04/2021    Order Specific Question:   Where should this test be performed?    Answer:   [facility]    Order Specific Question:   Full PFT: includes the following: basic spirometry, spirometry pre & post bronchodilator, diffusion capacity (DLCO), lung volumes    Answer:   Full PFT    Meds ordered this encounter  Medications  . Fluticasone-Salmeterol (ADVAIR DISKUS) 500-50 MCG/DOSE AEPB    Sig: Inhale 1 puff into the lungs 2 (two) times daily.    Dispense:  60 each    Refill:  5    Return in about 2 months (around 03/06/2020). after PFTs    Please do your part to reduce the spread of COVID-19.

## 2020-01-08 LAB — IGE: IgE (Immunoglobulin E), Serum: 51 kU/L (ref <=114)

## 2020-01-08 NOTE — Progress Notes (Signed)
Please let Sandra Roberson know that her antibody level and white blood cell level associated with allergies are not elevated.  She should stay on the prescribed inhalers. Thanks!

## 2020-01-20 DIAGNOSIS — Z23 Encounter for immunization: Secondary | ICD-10-CM | POA: Diagnosis not present

## 2020-02-02 ENCOUNTER — Encounter: Payer: Self-pay | Admitting: Allergy

## 2020-02-02 ENCOUNTER — Other Ambulatory Visit: Payer: Self-pay

## 2020-02-02 ENCOUNTER — Ambulatory Visit: Payer: BC Managed Care – PPO | Admitting: Allergy

## 2020-02-02 VITALS — BP 126/86 | HR 79 | Temp 97.7°F | Resp 18 | Ht 64.0 in | Wt 139.8 lb

## 2020-02-02 DIAGNOSIS — J3089 Other allergic rhinitis: Secondary | ICD-10-CM | POA: Diagnosis not present

## 2020-02-02 DIAGNOSIS — K9049 Malabsorption due to intolerance, not elsewhere classified: Secondary | ICD-10-CM | POA: Diagnosis not present

## 2020-02-02 DIAGNOSIS — J329 Chronic sinusitis, unspecified: Secondary | ICD-10-CM

## 2020-02-02 DIAGNOSIS — K2100 Gastro-esophageal reflux disease with esophagitis, without bleeding: Secondary | ICD-10-CM

## 2020-02-02 DIAGNOSIS — H1013 Acute atopic conjunctivitis, bilateral: Secondary | ICD-10-CM

## 2020-02-02 MED ORDER — AZELASTINE-FLUTICASONE 137-50 MCG/ACT NA SUSP: NASAL | 5 refills | Status: DC

## 2020-02-02 MED ORDER — OMEPRAZOLE 20 MG PO CPDR: DELAYED_RELEASE_CAPSULE | ORAL | 5 refills | Status: DC

## 2020-02-02 NOTE — Progress Notes (Signed)
New Patient Note  RE: Sandra Roberson MRN: [id number] DOB: 04-26-1976 Date of Office Visit: 02/02/2020  Referring provider: Chesley Noon, MD Primary care provider: Chesley Noon, MD  Chief Complaint: alleriges  History of present illness: Sandra Roberson is a 44 y.o. female presenting today for consultation for allergic rhinitis.   She reports having chronic sinus issues.  Over the last several years she reports would get 2 bad sinus infections in the spring and fall where she would have HA, eye itching, congestion, sinus pressure, drainage.  Denies fevers typically.  She would normally get an antibiotic course and a steroid injection to treat.    This September 2020 she reports getting a sinus infection that seem to not resolve like in previous years.  She states she was on at least 6 rounds of antibiotics for this issue, including amoxicillin, cefdinir.  Also has had 2 steroid injections as well.  The symptoms were as above but lingered.  She saw ENT in Dec 2020, Dr. Janace Hoard, who she states recommended she could have a sinus surgery done.  She has not had a sinus CT however she states ENT mentioned performing this if symptoms continued. She was recommended to complete a clindamycin course before proceeding with CT or surgery.    In Jan 2020 she states she had a bad illness where she loss sense of taste and smell. She states she felt like she had been knocked by a ton of bricks.  She also had respiratory symptoms where she was wheezing and short of breath where she couldn't catch her breath.  She states she had all the symptoms of COVID however she had multiple COVID test including antibody test that were all negative.  She was giving albuterol to try and states did not help her respiratory symptoms.  She then saw a pulmnologist, Dr. Carlis Abbott in Mar 2021 for these symptoms. Prior to her pulmnology visit she was started on advair 250.  She states the advair was increased to 500.   She states pulmonlogist wants to do "breathing tests".  Per records from pulmonology mod persistent asthma was suspected and her Advair was increase to 500/50 dose.  She had CBC and IgE down showing a normal IgE level.   She states she was given oral steroids but it increased her acid reflex and felt like she was having an "out of body experience".   She did get an antibiotic and steroid injection to help treat these symptoms.  She has had a CXR in Feb 2021 reported as normal.  Since she has been on antihistamines for testing today she has had itching of hands and feet.    She has been on Singulair for the past 2 years. For the general allergy symptoms she also takes Xyzal and Singulair in PM and Claritin during the day.  She also uses Flonase, saline rinses and is currently using an humidifier.  She has noticed more nasal drainage off antihistamines for testing today.    With her acid reflux she has found that prilosec works the best. She also tries to avoid foods that trigger reflux.    She reports having digestion issues.  She states with some types of bread like traditional piece of bread she has a hardtime digesting it.  But states she can eat like a flour tortilla without issue.   She reports she can't eat onions, tomatoes as it flares her reflux and cause bloating.    Review of  systems (in the past 4 weeks): Review of Systems  Constitutional: Negative.   HENT:       See HPI  Eyes:       See HPI  Respiratory:       See HPI  Cardiovascular: Negative.   Gastrointestinal: Negative.   Musculoskeletal: Negative.   Skin: Negative.   Neurological:       See HPI    All other systems negative unless noted above in HPI  Past medical history: Past Medical History:  Diagnosis Date   Asthma    GERD (gastroesophageal reflux disease)    Postpartum depression     Past surgical history: Past Surgical History:  Procedure Laterality Date   CESAREAN SECTION     LIVER RESECTION   2015    Family history:  Family History  Problem Relation Age of Onset   GER disease Father    Hypertension Mother    Breast cancer Maternal Grandmother    Congestive Heart Failure Maternal Grandfather     Social history:  Socioeconomic History   Marital status: Married  Network engineer History   Works for Writer  Tobacco Use   Smoking status: Never Smoker   Smokeless tobacco: Never Used  Substance and Sexual Activity   Alcohol use: Yes   Drug use: No    Medication List: Current Outpatient Medications  Medication Sig Dispense Refill   ALPRAZolam (XANAX) 0.5 MG tablet Take 0.5 mg by mouth as needed for anxiety.     buPROPion (WELLBUTRIN XL) 150 MG 24 hr tablet Take 150 mg by mouth daily.     fluticasone (FLONASE) 50 MCG/ACT nasal spray Place 2 sprays into both nostrils daily.     Fluticasone-Salmeterol (ADVAIR DISKUS) 500-50 MCG/DOSE AEPB Inhale 1 puff into the lungs 2 (two) times daily. 60 each 5   levocetirizine (XYZAL) 5 MG tablet Take 5 mg by mouth every evening.     lisdexamfetamine (VYVANSE) 40 MG capsule Take 40 mg by mouth every morning.     loratadine (CLARITIN) 10 MG tablet Take 10 mg by mouth daily.     montelukast (SINGULAIR) 10 MG tablet Take 10 mg by mouth at bedtime.     Omeprazole (PRILOSEC PO) Take 1 tablet by mouth daily.     Azelastine-Fluticasone 137-50 MCG/ACT SUSP Use 1 spray in each nostril twice daily 23 g 5   omeprazole (PRILOSEC) 20 MG capsule Take 1 capsule by mouth once daily 30 capsule 5   No current facility-administered medications for this visit.    Known medication allergies: Allergies  Allergen Reactions   Iodinated Diagnostic Agents Rash    Difficult to completely rule out gadolinium as an explanation for the pts rashes after iv dc in mri today (see note 10/12/14) -despite her history/reported history of tolerating contrasted MRI in the past and frequent sensitivity to adhesives instead.    Other Rash       Physical examination: Blood pressure 126/86, pulse 79, temperature 97.7 F (36.5 C), temperature source Temporal, resp. rate 18, height 5\' 4"  (1.626 m), weight 139 lb 12.8 oz (63.4 kg), SpO2 98 %.  General: Alert, interactive, in no acute distress. HEENT: PERRLA, TMs pearly gray, turbinates mildly edematous without discharge, post-pharynx non erythematous. Neck: Supple without lymphadenopathy. Lungs: Clear to auscultation without wheezing, rhonchi or rales. {no increased work of breathing. CV: Normal S1, S2 without murmurs. Abdomen: Nondistended, nontender. Skin: Warm and dry, without lesions or rashes. Extremities:  No clubbing, cyanosis or edema. Neuro:   Grossly  intact.  Diagnositics/Labs: Labs:  Component     Latest Ref Rng & Units 01/05/2020  WBC     4.0 - 10.5 K/uL 6.0  RBC     3.87 - 5.11 Mil/uL 3.67 (L)  Hemoglobin     12.0 - 15.0 g/dL 12.1  HCT     36.0 - 46.0 % 35.6 (L)  MCV     78.0 - 100.0 fl 97.2  MCHC     30.0 - 36.0 g/dL 34.0  RDW     11.5 - 15.5 % 13.3  Platelets     150.0 - 400.0 K/uL 335.0  Neutrophils     43.0 - 77.0 % 58.0  Lymphocytes     12.0 - 46.0 % 33.7  Monocytes Relative     3.0 - 12.0 % 5.7  Eosinophil     0.0 - 5.0 % 1.9  Basophil     0.0 - 3.0 % 0.7  NEUT#     1.4 - 7.7 K/uL 3.5  Lymphocyte #     0.7 - 4.0 K/uL 2.0  Monocyte #     0.1 - 1.0 K/uL 0.3  Eosinophils Absolute     0.0 - 0.7 K/uL 0.1  Basophils Absolute     0.0 - 0.1 K/uL 0.0  IgE (Immunoglobulin E), Serum     <OR=114 kU/L 51    Allergy testing: environmental allergy skin prick testing is dust mites.  Intradermal testing is positive to ragweed mix, mold mix 2. Select food allergy skin prick testing is positive to rye.  Allergy testing results were read and interpreted by provider, documented by clinical staff.   Assessment and plan: Allergic rhinitis with conjunctivitis Recurrent sinus infections Food intolerance GERD    - environmental allergy panel  is positive to ragweed, mols and dust mites - allergen avoidance measures discussed/handouts provided - continue long-acting antihistamine either Xyzal, Allegra or Zyrtec.  Can take additional doses in the day for improved symptom control - trial dymista 1 spray each nostril twice a day.  This is a combination nasal spray with Flonase + Astelin (nasal antihistamine).  This helps with both nasal congestion and drainage.  - for itchy/watery/red eyes can use over-the-counter Pataday or Pataday Xtra Strength 1 drop each eye daily as needed - allergen immunotherapy discussed today including protocol, benefits and risk.  Informational handout provided.  If interested in this therapuetic option you can check with your insurance carrier for coverage.  Let us know if you would like to proceed with this option.    - will obtain immunocompetence work-up for recurrent sinus infections: CBC w diff, CMP, immunoglobulins, complement levels and vaccine titers  - select food allergy testing is positive to rye.  Would avoid rye products in the diet.    - continue daily use of Prilosec for reflux control.  Continue to avoid known foods to worsen reflux symptoms  Follow-up 3-4 months or sooner if needed   I appreciate the opportunity to take part in [place] care. Please do not hesitate to contact me with questions.  Sincerely,   Prudy Feeler, MD Allergy/Immunology [facility]

## 2020-02-02 NOTE — Patient Instructions (Addendum)
-   environmental allergy panel is positive to ragweed, mols and dust mites - allergen avoidance measures discussed/handouts provided - continue long-acting antihistamine either Xyzal, Allegra or Zyrtec.  Can take additional doses in the day for improved symptom control - trial dymista 1 spray each nostril twice a day.  This is a combination nasal spray with Flonase + Astelin (nasal antihistamine).  This helps with both nasal congestion and drainage.  - for itchy/watery/red eyes can use over-the-counter Pataday or Pataday Xtra Strength 1 drop each eye daily as needed - allergen immunotherapy discussed today including protocol, benefits and risk.  Informational handout provided.  If interested in this therapuetic option you can check with your insurance carrier for coverage.  Let us know if you would like to proceed with this option.    - will obtain immunocompetence work-up for recurrent sinus infections: CBC w diff, CMP, immunoglobulins, complement levels and vaccine titers  - select food allergy testing is positive to rye.  Would avoid rye products in the diet.    - continue daily use of Prilosec for reflux control.  Continue to avoid known foods to worsen reflux symptoms  Follow-up 3-4 months or sooner if needed  **We are ordering labs, so please allow 1-2 weeks for the results to come back.  With the newly implemented Cures Act, the labs might be visible to you at the same time that they become visible to me.  However, I will not address the results until all of the results come  back, so please be patient.  In the meantime, continue avoiding your triggering food(s) in your After Visit Summary, including avoidance measures (if applicable), until you hear from me about the results.

## 2020-02-08 LAB — STREP PNEUMONIAE 23 SEROTYPES IGG
Pneumo Ab Type 1*: 0.5 ug/mL — ABNORMAL LOW (ref >1.3)
Pneumo Ab Type 12 (12F)*: <0.1 ug/mL — ABNORMAL LOW (ref >1.3)
Pneumo Ab Type 14*: 10.8 ug/mL (ref >1.3)
Pneumo Ab Type 17 (17F)*: 1.5 ug/mL (ref >1.3)
Pneumo Ab Type 19 (19F)*: 1.4 ug/mL (ref >1.3)
Pneumo Ab Type 2*: 0.9 ug/mL — ABNORMAL LOW (ref >1.3)
Pneumo Ab Type 20*: 1.7 ug/mL (ref >1.3)
Pneumo Ab Type 22 (22F)*: 0.1 ug/mL — ABNORMAL LOW (ref >1.3)
Pneumo Ab Type 23 (23F)*: <0.1 ug/mL — ABNORMAL LOW (ref >1.3)
Pneumo Ab Type 26 (6B)*: <0.1 ug/mL — ABNORMAL LOW (ref >1.3)
Pneumo Ab Type 3*: 4.9 ug/mL (ref >1.3)
Pneumo Ab Type 34 (10A)*: 0.7 ug/mL — ABNORMAL LOW (ref >1.3)
Pneumo Ab Type 4*: 1.3 ug/mL — ABNORMAL LOW (ref >1.3)
Pneumo Ab Type 43 (11A)*: 1 ug/mL — ABNORMAL LOW (ref >1.3)
Pneumo Ab Type 5*: <0.1 ug/mL — ABNORMAL LOW (ref >1.3)
Pneumo Ab Type 51 (7F)*: 0.6 ug/mL — ABNORMAL LOW (ref >1.3)
Pneumo Ab Type 54 (15B)*: 4.3 ug/mL (ref >1.3)
Pneumo Ab Type 56 (18C)*: 0.2 ug/mL — ABNORMAL LOW (ref >1.3)
Pneumo Ab Type 57 (19A)*: 2.9 ug/mL (ref >1.3)
Pneumo Ab Type 68 (9V)*: <0.1 ug/mL — ABNORMAL LOW (ref >1.3)
Pneumo Ab Type 70 (33F)*: 9.3 ug/mL (ref >1.3)
Pneumo Ab Type 8*: 1.2 ug/mL — ABNORMAL LOW (ref >1.3)
Pneumo Ab Type 9 (9N)*: <0.1 ug/mL — ABNORMAL LOW (ref >1.3)

## 2020-02-08 LAB — CBC WITH DIFFERENTIAL
Basophils Absolute: 0 10*3/uL (ref 0.0–0.2)
Basos: 1 %
EOS (ABSOLUTE): 0.1 10*3/uL (ref 0.0–0.4)
Eos: 1 %
Hematocrit: 40.9 % (ref 34.0–46.6)
Hemoglobin: 13.5 g/dL (ref 11.1–15.9)
Immature Grans (Abs): 0 10*3/uL (ref 0.0–0.1)
Immature Granulocytes: 0 %
Lymphocytes Absolute: 1.9 10*3/uL (ref 0.7–3.1)
Lymphs: 32 %
MCH: 32.1 pg (ref 26.6–33.0)
MCHC: 33 g/dL (ref 31.5–35.7)
MCV: 97 fL (ref 79–97)
Monocytes Absolute: 0.4 10*3/uL (ref 0.1–0.9)
Monocytes: 7 %
Neutrophils Absolute: 3.5 10*3/uL (ref 1.4–7.0)
Neutrophils: 59 %
RBC: 4.21 x10E6/uL (ref 3.77–5.28)
RDW: 12.8 % (ref 11.7–15.4)
WBC: 5.8 10*3/uL (ref 3.4–10.8)

## 2020-02-08 LAB — DIPHTHERIA / TETANUS ANTIBODY PANEL
Diphtheria Ab: 0.21 IU/mL (ref <0.10)
Tetanus Ab, IgG: 1.75 IU/mL (ref <0.10)

## 2020-02-08 LAB — COMPREHENSIVE METABOLIC PANEL
ALT: 23 IU/L (ref 0–32)
AST: 25 IU/L (ref 0–40)
Albumin/Globulin Ratio: 1.7 (ref 1.2–2.2)
Albumin: 4.6 g/dL (ref 3.8–4.8)
Alkaline Phosphatase: 103 IU/L (ref 39–117)
BUN/Creatinine Ratio: 16 (ref 9–23)
BUN: 11 mg/dL (ref 6–24)
Bilirubin Total: 0.2 mg/dL (ref 0.0–1.2)
CO2: 25 mmol/L (ref 20–29)
Calcium: 9.9 mg/dL (ref 8.7–10.2)
Chloride: 98 mmol/L (ref 96–106)
Creatinine, Ser: 0.67 mg/dL (ref 0.57–1.00)
GFR calc Af Amer: 125 mL/min/{1.73_m2} (ref >59)
GFR calc non Af Amer: 108 mL/min/{1.73_m2} (ref >59)
Globulin, Total: 2.7 g/dL (ref 1.5–4.5)
Glucose: 88 mg/dL (ref 65–99)
Potassium: 4.4 mmol/L (ref 3.5–5.2)
Sodium: 138 mmol/L (ref 134–144)
Total Protein: 7.3 g/dL (ref 6.0–8.5)

## 2020-02-08 LAB — IGG, IGA, IGM
IgA/Immunoglobulin A, Serum: 208 mg/dL (ref 87–352)
IgG (Immunoglobin G), Serum: 984 mg/dL (ref 586–1602)
IgM (Immunoglobulin M), Srm: 134 mg/dL (ref 26–217)

## 2020-02-08 LAB — COMPLEMENT, TOTAL: Compl, Total (CH50): 56 U/mL (ref >41)

## 2020-02-13 ENCOUNTER — Telehealth: Payer: Self-pay | Admitting: *Deleted

## 2020-02-13 NOTE — Telephone Encounter (Signed)
Patient called stating that a sinus infection is coming on this morning. She stated that she has been on 6 different types of antibiotics and they haven't helped. She states that she has had at least 6 or 7 sinus infections since last year and she knows when they are coming on. She is wondering if it would help to get the pneumovax earlier than the 27th which is when she is scheduled to get her pneumovax to help with the sinus infection? Please advise.

## 2020-02-13 NOTE — Telephone Encounter (Signed)
The Pneumovax will not work that fast to help improve or stave off the current symptoms she has.  It would actually be best if she get the pneumovax when she if feeling well so the 27th may still be good date.

## 2020-02-13 NOTE — Telephone Encounter (Signed)
Called and spoke with the patient and she verbalized understanding. She did ask if you would send in an antibiotic for her to help with her sinus infection. She states that 5 day antibiotics help and she cannot do oral steroids. She states that Levofloxacin has helped in the past. Please advise.

## 2020-02-14 MED ORDER — AMOXICILLIN-POT CLAVULANATE 875-125 MG PO TABS: 1.0000 | ORAL_TABLET | Freq: Two times a day (BID) | ORAL | 0 refills | Status: AC

## 2020-02-14 NOTE — Telephone Encounter (Signed)
Can send in Augmentin 875mg  1 tab twice a day x 10 days.   This is a standard first line antibiotic for treatment of sinus infections.   She does not appear to have a penicillin allergy - please confirm.

## 2020-02-14 NOTE — Telephone Encounter (Signed)
Prescription has been sent in to requested pharmacy. Called and advised to patient. Patient verbalized understanding.

## 2020-02-14 NOTE — Addendum Note (Signed)
Addended by: Chip Boer R on: 02/14/2020 09:00 AM   Modules accepted: Orders

## 2020-02-20 ENCOUNTER — Ambulatory Visit (INDEPENDENT_AMBULATORY_CARE_PROVIDER_SITE_OTHER): Payer: BC Managed Care – PPO

## 2020-02-20 ENCOUNTER — Other Ambulatory Visit: Payer: Self-pay

## 2020-02-20 DIAGNOSIS — Z23 Encounter for immunization: Secondary | ICD-10-CM

## 2020-02-20 DIAGNOSIS — J329 Chronic sinusitis, unspecified: Secondary | ICD-10-CM

## 2020-02-20 NOTE — Progress Notes (Signed)
Patient received Pneumovax23 in office today. She waited 30 minutes following the injection with no local or systemic reactions upon leaving the office.

## 2020-03-23 DIAGNOSIS — J0101 Acute recurrent maxillary sinusitis: Secondary | ICD-10-CM | POA: Diagnosis not present

## 2020-04-01 DIAGNOSIS — J320 Chronic maxillary sinusitis: Secondary | ICD-10-CM | POA: Diagnosis not present

## 2020-04-04 DIAGNOSIS — J3089 Other allergic rhinitis: Secondary | ICD-10-CM | POA: Diagnosis not present

## 2020-04-04 DIAGNOSIS — J324 Chronic pansinusitis: Secondary | ICD-10-CM | POA: Diagnosis not present

## 2020-04-05 ENCOUNTER — Other Ambulatory Visit (HOSPITAL_COMMUNITY): Payer: BC Managed Care – PPO

## 2020-04-08 ENCOUNTER — Ambulatory Visit: Payer: BC Managed Care – PPO | Admitting: Adult Health

## 2020-04-11 DIAGNOSIS — J324 Chronic pansinusitis: Secondary | ICD-10-CM | POA: Diagnosis not present

## 2020-04-25 DIAGNOSIS — R519 Headache, unspecified: Secondary | ICD-10-CM | POA: Diagnosis not present

## 2020-04-25 DIAGNOSIS — J0191 Acute recurrent sinusitis, unspecified: Secondary | ICD-10-CM | POA: Diagnosis not present

## 2020-04-25 DIAGNOSIS — J324 Chronic pansinusitis: Secondary | ICD-10-CM | POA: Diagnosis not present

## 2020-05-02 DIAGNOSIS — G43111 Migraine with aura, intractable, with status migrainosus: Secondary | ICD-10-CM | POA: Diagnosis not present

## 2020-05-15 DIAGNOSIS — G43111 Migraine with aura, intractable, with status migrainosus: Secondary | ICD-10-CM | POA: Diagnosis not present

## 2020-05-15 DIAGNOSIS — G43711 Chronic migraine without aura, intractable, with status migrainosus: Secondary | ICD-10-CM | POA: Diagnosis not present

## 2020-07-09 DIAGNOSIS — Z1159 Encounter for screening for other viral diseases: Secondary | ICD-10-CM | POA: Diagnosis not present

## 2020-08-07 DIAGNOSIS — Z01419 Encounter for gynecological examination (general) (routine) without abnormal findings: Secondary | ICD-10-CM | POA: Diagnosis not present

## 2020-08-07 DIAGNOSIS — Z1151 Encounter for screening for human papillomavirus (HPV): Secondary | ICD-10-CM | POA: Diagnosis not present

## 2020-08-07 DIAGNOSIS — Z1231 Encounter for screening mammogram for malignant neoplasm of breast: Secondary | ICD-10-CM | POA: Diagnosis not present

## 2020-08-07 DIAGNOSIS — Z6824 Body mass index (BMI) 24.0-24.9, adult: Secondary | ICD-10-CM | POA: Diagnosis not present

## 2020-08-15 ENCOUNTER — Other Ambulatory Visit: Payer: Self-pay | Admitting: Allergy

## 2020-09-10 DIAGNOSIS — J0140 Acute pansinusitis, unspecified: Secondary | ICD-10-CM | POA: Diagnosis not present

## 2020-09-16 ENCOUNTER — Other Ambulatory Visit: Payer: Self-pay | Admitting: Allergy

## 2020-09-25 DIAGNOSIS — G43711 Chronic migraine without aura, intractable, with status migrainosus: Secondary | ICD-10-CM | POA: Diagnosis not present

## 2020-10-17 DIAGNOSIS — J0140 Acute pansinusitis, unspecified: Secondary | ICD-10-CM | POA: Diagnosis not present

## 2020-10-31 DIAGNOSIS — Z1159 Encounter for screening for other viral diseases: Secondary | ICD-10-CM | POA: Diagnosis not present

## 2021-03-10 DIAGNOSIS — H938X3 Other specified disorders of ear, bilateral: Secondary | ICD-10-CM | POA: Diagnosis not present

## 2021-03-10 DIAGNOSIS — J324 Chronic pansinusitis: Secondary | ICD-10-CM | POA: Diagnosis not present

## 2021-03-10 DIAGNOSIS — E049 Nontoxic goiter, unspecified: Secondary | ICD-10-CM | POA: Diagnosis not present

## 2021-03-14 DIAGNOSIS — E041 Nontoxic single thyroid nodule: Secondary | ICD-10-CM | POA: Diagnosis not present

## 2021-03-14 DIAGNOSIS — J324 Chronic pansinusitis: Secondary | ICD-10-CM | POA: Diagnosis not present

## 2021-03-14 DIAGNOSIS — E049 Nontoxic goiter, unspecified: Secondary | ICD-10-CM | POA: Diagnosis not present

## 2021-03-18 ENCOUNTER — Other Ambulatory Visit: Payer: Self-pay | Admitting: Otolaryngology

## 2021-03-18 DIAGNOSIS — E041 Nontoxic single thyroid nodule: Secondary | ICD-10-CM

## 2021-03-20 ENCOUNTER — Ambulatory Visit
Admission: RE | Admit: 2021-03-20 | Discharge: 2021-03-20 | Disposition: A | Discharge status: Home / Self Care | Payer: BC Managed Care – PPO | Source: Ambulatory Visit | Attending: Otolaryngology | Admitting: Otolaryngology

## 2021-03-20 ENCOUNTER — Other Ambulatory Visit (HOSPITAL_COMMUNITY)
Admission: RE | Admit: 2021-03-20 | Discharge: 2021-03-20 | Disposition: A | Discharge status: Home / Self Care | Payer: BC Managed Care – PPO | Source: Ambulatory Visit | Attending: Otolaryngology | Admitting: Otolaryngology

## 2021-03-20 DIAGNOSIS — E041 Nontoxic single thyroid nodule: Secondary | ICD-10-CM

## 2021-03-25 LAB — CYTOLOGY - NON PAP

## 2021-03-26 ENCOUNTER — Other Ambulatory Visit: Payer: Self-pay | Admitting: Otolaryngology

## 2021-03-27 ENCOUNTER — Other Ambulatory Visit: Payer: Self-pay | Admitting: Otolaryngology

## 2021-03-27 DIAGNOSIS — C73 Malignant neoplasm of thyroid gland: Secondary | ICD-10-CM

## 2021-03-28 DIAGNOSIS — C73 Malignant neoplasm of thyroid gland: Secondary | ICD-10-CM | POA: Diagnosis not present

## 2021-04-01 ENCOUNTER — Other Ambulatory Visit: Payer: Self-pay

## 2021-04-01 ENCOUNTER — Ambulatory Visit
Admission: RE | Admit: 2021-04-01 | Discharge: 2021-04-01 | Disposition: A | Discharge status: Home / Self Care | Payer: BC Managed Care – PPO | Source: Ambulatory Visit | Attending: Otolaryngology | Admitting: Otolaryngology

## 2021-04-01 DIAGNOSIS — C73 Malignant neoplasm of thyroid gland: Secondary | ICD-10-CM | POA: Diagnosis not present

## 2021-04-01 DIAGNOSIS — E041 Nontoxic single thyroid nodule: Secondary | ICD-10-CM | POA: Diagnosis not present

## 2021-04-01 MED ORDER — GADOBENATE DIMEGLUMINE 529 MG/ML IV SOLN
12.0000 mL | Freq: Once | INTRAVENOUS | Status: AC | PRN
  Administered 2021-04-01: 12 mL via INTRAVENOUS

## 2021-04-03 ENCOUNTER — Ambulatory Visit: Payer: Self-pay | Admitting: Surgery

## 2021-04-03 DIAGNOSIS — C73 Malignant neoplasm of thyroid gland: Secondary | ICD-10-CM | POA: Diagnosis not present

## 2021-04-03 DIAGNOSIS — J324 Chronic pansinusitis: Secondary | ICD-10-CM | POA: Diagnosis not present

## 2021-04-05 ENCOUNTER — Other Ambulatory Visit: Payer: BC Managed Care – PPO

## 2021-04-10 ENCOUNTER — Encounter (HOSPITAL_COMMUNITY): Payer: Self-pay

## 2021-05-08 NOTE — Patient Instructions (Addendum)
DUE TO COVID-19 ONLY ONE VISITOR IS ALLOWED TO COME WITH YOU AND STAY IN THE WAITING ROOM ONLY DURING PRE OP AND PROCEDURE DAY OF SURGERY. THE 2 VISITORS  MAY VISIT WITH YOU AFTER SURGERY IN YOUR PRIVATE ROOM DURING VISITING HOURS ONLY!  YOU NEED TO HAVE A COVID 19 TEST ON___7/20____ @__2 :55p_____, THIS TEST MUST BE DONE BEFORE SURGERY,  COVID TESTING SITE [place] [id number], IT IS ON THE RIGHT GOING OUT [address] APPROXIMATELY  2 MINUTES PAST [employer] ON THE RIGHT. ONCE YOUR COVID TEST IS COMPLETED,  PLEASE BEGIN THE QUARANTINE INSTRUCTIONS AS OUTLINED IN YOUR HANDOUT.                Sandra Roberson     Your procedure is scheduled on: 05/16/21   Report to [facility] Main  Entrance   Report to admitting at  7:30 AM     Call this number if you have problems the morning of surgery [phone]    Remember: Do not eat food after Midnight.   You may have clear liquids until 6:30 am   BRUSH YOUR TEETH MORNING OF SURGERY AND RINSE YOUR MOUTH OUT, NO CHEWING GUM CANDY OR MINTS.     Take these medicines the morning of surgery with A SIP OF WATER: Omeprazole, Wellbutrin. Use inhalers and bring with you.               Use eye drops as usual, use flonase if needed, take Xanax if needed                                You may not have any metal on your body including hair pins and              piercings  Do not wear jewelry, make-up, lotions, powders or perfumes, deodorant             Do not wear nail polish on your fingernails.  Do not shave  48 hours prior to surgery.               Do not bring valuables to the hospital. [place].  Contacts, dentures or bridgework may not be worn into surgery.                  Please read over the following fact sheets you were given: _____________________________________________________________________             [facility] - Preparing for  Surgery Before surgery, you can play an important role.  Because skin is not sterile, your skin needs to be as free of germs as possible.  You can reduce the number of germs on your skin by washing with CHG (chlorahexidine gluconate) soap before surgery.  CHG is an antiseptic cleaner which kills germs and bonds with the skin to continue killing germs even after washing. Please DO NOT use if you have an allergy to CHG or antibacterial soaps.  If your skin becomes reddened/irritated stop using the CHG and inform your nurse when you arrive at Short Stay. Do not shave (including legs and underarms) for at least 48 hours prior to the first CHG shower.  Please follow these instructions carefully:  1.  Shower with CHG Soap the night before surgery and the  morning of Surgery.  2.  If you choose to wash your hair, wash your hair first as usual with your  normal  shampoo.  3.  After you shampoo, rinse your hair and body thoroughly to remove the  shampoo.                                        4.  Use CHG as you would any other liquid soap.  You can apply chg directly  to the skin and wash                       Gently with a scrungie or clean washcloth.  5.  Apply the CHG Soap to your body ONLY FROM THE NECK DOWN.   Do not use on face/ open                           Wound or open sores. Avoid contact with eyes, ears mouth and genitals (private parts).                       Wash face,  Genitals (private parts) with your normal soap.             6.  Wash thoroughly, paying special attention to the area where your surgery  will be performed.  7.  Thoroughly rinse your body with warm water from the neck down.  8.  DO NOT shower/wash with your normal soap after using and rinsing off  the CHG Soap.             9.  Pat yourself dry with a clean towel.            10.  Wear clean pajamas.            11.  Place clean sheets on your bed the night of your first shower and do not  sleep with pets. Day of Surgery : Do  not apply any lotions/deodorants the morning of surgery.  Please wear clean clothes to the hospital/surgery center.  FAILURE TO FOLLOW THESE INSTRUCTIONS MAY RESULT IN THE CANCELLATION OF YOUR SURGERY PATIENT SIGNATURE_________________________________  NURSE SIGNATURE__________________________________  ________________________________________________________________________

## 2021-05-09 ENCOUNTER — Ambulatory Visit (HOSPITAL_COMMUNITY)
Admission: RE | Admit: 2021-05-09 | Discharge: 2021-05-09 | Disposition: A | Discharge status: Home / Self Care | Payer: BC Managed Care – PPO | Source: Ambulatory Visit | Attending: Anesthesiology | Admitting: Anesthesiology

## 2021-05-09 ENCOUNTER — Encounter (HOSPITAL_COMMUNITY)
Admission: RE | Admit: 2021-05-09 | Discharge: 2021-05-09 | Disposition: A | Discharge status: Home / Self Care | Payer: BC Managed Care – PPO | Source: Ambulatory Visit | Attending: Surgery | Admitting: Surgery

## 2021-05-09 ENCOUNTER — Other Ambulatory Visit: Payer: Self-pay

## 2021-05-09 ENCOUNTER — Encounter (HOSPITAL_COMMUNITY): Payer: Self-pay

## 2021-05-09 DIAGNOSIS — Z01818 Encounter for other preprocedural examination: Secondary | ICD-10-CM | POA: Diagnosis not present

## 2021-05-09 DIAGNOSIS — Z01811 Encounter for preprocedural respiratory examination: Secondary | ICD-10-CM | POA: Insufficient documentation

## 2021-05-09 LAB — CBC
HCT: 36.5 % (ref 36.0–46.0)
Hemoglobin: 12.1 g/dL (ref 12.0–15.0)
MCH: 31.3 pg (ref 26.0–34.0)
MCHC: 33.2 g/dL (ref 30.0–36.0)
MCV: 94.6 fL (ref 80.0–100.0)
Platelets: 346 10*3/uL (ref 150–400)
RBC: 3.86 MIL/uL — ABNORMAL LOW (ref 3.87–5.11)
RDW: 13.8 % (ref 11.5–15.5)
WBC: 6.7 10*3/uL (ref 4.0–10.5)
nRBC: 0 % (ref 0.0–0.2)

## 2021-05-09 NOTE — Progress Notes (Signed)
COVID Vaccine Completed:Yes Date COVID Vaccine completed:01/20/20-booster 08/29/20 COVID vaccine manufacturer:   Moderna     PCP - Dr. Diamantina Monks [place] 03/28/21 Cardiologist - none  Chest x-ray - 05/09/21-chart EKG - no Stress Test - no ECHO - no Cardiac Cath - no Pacemaker/ICD device last checked:NA  Sleep Study - no CPAP -   Fasting Blood Sugar - no Checks Blood Sugar _____ times a day  Blood Thinner Instructions:no Aspirin Instructions: Last Dose:  Anesthesia review: yes- Pt's thyroid is large on Rt anterior neck causing some obstruction when eating certain foods. Jessica PA  in to see Pt.  Patient denies shortness of breath, fever, cough and chest pain at PAT appointment Pt reports no SOB climbing stairs, doing housework or with ADLs. She has a cough due to thyroid but no difficulty breathing.  Patient verbalized understanding of instructions that were given to them at the PAT appointment. Patient was also instructed that they will need to review over the PAT instructions again at home before surgery. yes

## 2021-05-09 NOTE — Progress Notes (Addendum)
Anesthesia Chart Review   Case: [id number] Date/Time: 05/16/21 0915   Procedure: TOTAL THYROIDECTOMY WITH LIMITED LYMPH NODE DISSECTION   Anesthesia type: General   Pre-op diagnosis: papillary thyroid carcinoma   Location: [facility] ROOM 01 / [facility]   Surgeons: Armandina Gemma, MD       [facility] never smoker with h/o GERD, papillary thyroid carcinoma scheduled for above procedure 05/16/21 Dr. Armandina Gemma.   MR Neck 07/02/2021 shows right thyroid mass with mild leftward displacement of trachea.  Pt reports some difficulty with eating solids.  She denies shortness of breath. Denies snoring or history of sleep apnea, she is able to lie flat without difficulty breathing. No previous anesthesia complications.   Discussed with Dr. Royce Macadamia. Anticipate pt can proceed with planned procedure barring acute status change.   VS: BP (!) 140/93   Pulse 72   Temp 36.7 C (Oral)   Resp 18   Ht 5' 4.5" (1.638 m)   Wt 64 kg   LMP 04/28/2021   SpO2 100%   BMI 23.86 kg/m   PROVIDERS: Chesley Noon, MD is PCP    LABS: Labs reviewed: Acceptable for surgery. (all labs ordered are listed, but only abnormal results are displayed)  Labs Reviewed  CBC - Abnormal; Notable for the following components:      Result Value   RBC 3.86 (*)    All other components within normal limits     IMAGES: MR 04/01/2021 IMPRESSION: Right thyroid mass with mild leftward displacement of the trachea consistent with reported malignancy.   No adenopathy.  EKG:   CV:  Past Medical History:  Diagnosis Date   GERD (gastroesophageal reflux disease)    Postpartum depression     Past Surgical History:  Procedure Laterality Date   CESAREAN SECTION  2010   LIVER RESECTION  2015   LIVER RESECTION  09/2013    MEDICATIONS:  ALPRAZolam (XANAX) 0.5 MG tablet   Azelastine-Fluticasone 137-50 MCG/ACT SUSP   Bromelains 500 MG TABS   buPROPion (WELLBUTRIN XL) 150 MG 24 hr tablet   fluticasone (FLONASE) 50 MCG/ACT  nasal spray   Fluticasone-Salmeterol (ADVAIR DISKUS) 500-50 MCG/DOSE AEPB   guaiFENesin (MUCINEX) 600 MG 12 hr tablet   lisdexamfetamine (VYVANSE) 30 MG capsule   loratadine (CLARITIN) 10 MG tablet   Olopatadine HCl 0.2 % SOLN   omeprazole (PRILOSEC) 20 MG capsule   Sodium Chloride-Xylitol (XLEAR SINUS CARE SPRAY NA)   No current facility-administered medications for this encounter.   Konrad Felix, PA-C [facility] Pre-Surgical Testing [phone]

## 2021-05-09 NOTE — Anesthesia Preprocedure Evaluation (Addendum)
Anesthesia Evaluation  Patient identified by MRN, date of birth, ID band Patient awake    Reviewed: Allergy & Precautions, H&P , NPO status , Patient's Chart, lab work & pertinent test results, reviewed documented beta blocker date and time   Airway Mallampati: II  TM Distance: >3 FB Neck ROM: full    Dental no notable dental hx.    Pulmonary neg pulmonary ROS,    Pulmonary exam normal breath sounds clear to auscultation       Cardiovascular Exercise Tolerance: Good negative cardio ROS   Rhythm:regular Rate:Normal     Neuro/Psych PSYCHIATRIC DISORDERS Anxiety Depression negative neurological ROS     GI/Hepatic Neg liver ROS, GERD  Medicated,  Endo/Other  negative endocrine ROS  Renal/GU negative Renal ROS  negative genitourinary   Musculoskeletal   Abdominal   Peds  Hematology negative hematology ROS (+)   Anesthesia Other Findings   Reproductive/Obstetrics negative OB ROS                             Anesthesia Physical Anesthesia Plan  ASA: 2  Anesthesia Plan: General   Post-op Pain Management:    Induction:   PONV Risk Score and Plan:   Airway Management Planned:   Additional Equipment:   Intra-op Plan:   Post-operative Plan:   Informed Consent: I have reviewed the patients History and Physical, chart, labs and discussed the procedure including the risks, benefits and alternatives for the proposed anesthesia with the patient or authorized representative who has indicated his/her understanding and acceptance.     Dental Advisory Given  Plan Discussed with: CRNA  Anesthesia Plan Comments: (See PAT note 05/09/2021, Konrad Felix, PA-C  45 y.o. never smoker with h/o GERD, papillary thyroid carcinoma scheduled for above procedure 05/16/21 Dr. Armandina Gemma.  MR Neck 07/02/2021 shows right thyroid mass with mild leftward displacement of trachea.  Pt reports some  difficulty with eating solids.  She denies shortness of breath. Denies snoring or history of sleep apnea, she is able to lie flat without difficulty breathing. No previous anesthesia complications.  Discussed with Dr. Royce Macadamia. Anticipate pt can proceed with planned procedure barring acute status change.   VS: BP (!) 140/93   Pulse 72   Temp 36.7 C (Oral)   Resp 18   Ht 5' 4.5" (1.638 m)   Wt 64 kg   LMP 04/28/2021   SpO2 100%   BMI 23.86 kg/m   MR 04/01/2021 IMPRESSION: Right thyroid mass with mild leftward displacement of the trachea consistent with reported malignancy. )       Anesthesia Quick Evaluation

## 2021-05-14 ENCOUNTER — Encounter (HOSPITAL_COMMUNITY): Payer: Self-pay | Admitting: Surgery

## 2021-05-14 ENCOUNTER — Other Ambulatory Visit (HOSPITAL_COMMUNITY)
Admission: RE | Admit: 2021-05-14 | Discharge: 2021-05-14 | Disposition: A | Discharge status: Home / Self Care | Payer: BC Managed Care – PPO | Source: Ambulatory Visit | Attending: Surgery | Admitting: Surgery

## 2021-05-14 DIAGNOSIS — Z20822 Contact with and (suspected) exposure to covid-19: Secondary | ICD-10-CM | POA: Diagnosis not present

## 2021-05-14 DIAGNOSIS — Z01812 Encounter for preprocedural laboratory examination: Secondary | ICD-10-CM | POA: Diagnosis not present

## 2021-05-14 DIAGNOSIS — C73 Malignant neoplasm of thyroid gland: Secondary | ICD-10-CM | POA: Diagnosis present

## 2021-05-14 LAB — SARS CORONAVIRUS 2 (TAT 6-24 HRS): SARS Coronavirus 2: NEGATIVE

## 2021-05-14 NOTE — H&P (Signed)
General Surgery [facility]  Sandra Roberson DOB: 12-15-1975 Married / Language: English / Race: White Female   History of Present Illness   The patient is a 45 year old female who presents with thyroid cancer.  CHIEF COMPLAINT: papillary thyroid carcinoma  Patient is self-referred.  Her primary care physician is Dr. Anastasia Pall.  Patient had been undergoing evaluation for chronic sinusitis.  She has had intermittent problems over the past 2-3 years.  She was noted on physical examination by her ENT to have a mass in the right side of the neck.  She was referred for ultrasound and then 4 fine-needle aspiration biopsy which was performed on Mar 20, 2021.  This demonstrated findings consistent with papillary thyroid carcinoma, Bethesda category VI.  Patient subsequently underwent an MRI scan of the neck on April 01, 2021.  This shows a mass in the right lobe of the thyroid measuring 2.6 x 3.2 x 6.4 cm.  There is leftward displacement of the trachea.  Were no abnormal lymph nodes identified.  Patient now presents to discuss thyroid surgery for management of papillary thyroid carcinoma.  Patient is accompanied by her husband.  She has had no prior head or neck surgery.  There is no family history of thyroid cancer or other endocrine neoplasm.  The patient has never been on thyroid medication.  Her TSH level is normal at 1.43.   Past Surgical History  Cesarean Section - 1   Liver Surgery    Diagnostic Studies History  Colonoscopy   5-10 years ago Mammogram   1-3 years ago Pap Smear   1-5 years ago  Allergies  No Known Drug Allergies   Allergies Reconciled    Medication History Vyvanse  (30MG  Capsule, Oral) Active. Vyvanse  (40MG  Capsule, Oral) Active. ALPRAZolam  (0.5MG  Tablet, Oral) Active. Advair Diskus  (500-50MCG/DOSE Aero Pow Br Act, Inhalation) Active. Albuterol Sulfate HFA  (108 (90 Base)MCG/ACT Aerosol Soln, Inhalation) Active. Azelastine-Fluticasone   (137-50MCG/ACT Suspension, Nasal) Active. buPROPion HCl ER (XL)  (150MG  Tablet ER 24HR, Oral) Active. Fluconazole  (100MG  Tablet, Oral) Active. levoFLOXacin  (500MG  Tablet, Oral) Active. Methocarbamol  (500MG  Tablet, Oral) Active. Montelukast Sodium  (10MG  Tablet, Oral) Active. Omeprazole  (20MG  Capsule DR, Oral) Active. predniSONE  (10MG  Tablet, Oral) Active. predniSONE  (20MG  Tablet, Oral) Active. Verapamil HCl ER  (120MG  Tablet ER, Oral) Active. Medications Reconciled   Social History  Alcohol use   Moderate alcohol use. Caffeine use   Coffee. No drug use   Tobacco use   Never smoker.  Family History  Alcohol Abuse   Father. Heart disease in female family member before age 19   Heart disease in female family member before age 80   Hypertension   Father, Mother.  Pregnancy / Birth History  Age at menarche   60 years. Contraceptive History   Oral contraceptives. Gravida   1 Irregular periods   Maternal age   43-35 Para   1  Other Problems  Anxiety Disorder   Depression   Gastroesophageal Reflux Disease   Migraine Headache   Other disease, cancer, significant illness   Thyroid Cancer    Review of Systems  General Present- Fatigue. Not Present- Appetite Loss, Chills, Fever, Night Sweats, Weight Gain and Weight Loss. Skin Not Present- Change in Wart/Mole, Dryness, Hives, Jaundice, New Lesions, Non-Healing Wounds, Rash and Ulcer. HEENT Present- Hoarseness, Seasonal Allergies, Sinus Pain and Sore Throat. Not Present- Earache, Hearing Loss, Nose Bleed, Oral Ulcers, Ringing in the Ears,  Visual Disturbances, Wears glasses/contact lenses and Yellow Eyes. Respiratory Present- Chronic Cough. Not Present- Bloody sputum, Difficulty Breathing, Snoring and Wheezing. Breast Not Present- Breast Mass, Breast Pain, Nipple Discharge and Skin Changes. Cardiovascular Not Present- Chest Pain, Difficulty Breathing Lying Down, Leg Cramps, Palpitations, Rapid Heart Rate, Shortness of Breath and  Swelling of Extremities. Gastrointestinal Present- Constipation and Indigestion. Not Present- Abdominal Pain, Bloating, Bloody Stool, Change in Bowel Habits, Chronic diarrhea, Difficulty Swallowing, Excessive gas, Gets full quickly at meals, Hemorrhoids, Nausea, Rectal Pain and Vomiting. Female Genitourinary Not Present- Frequency, Nocturia, Painful Urination, Pelvic Pain and Urgency. Musculoskeletal Not Present- Back Pain, Joint Pain, Joint Stiffness, Muscle Pain, Muscle Weakness and Swelling of Extremities. Neurological Present- Headaches. Not Present- Decreased Memory, Fainting, Numbness, Seizures, Tingling, Tremor, Trouble walking and Weakness. Psychiatric Present- Anxiety and Change in Sleep Pattern. Not Present- Bipolar, Depression, Fearful and Frequent crying. Endocrine Present- Hair Changes and Hot flashes. Not Present- Cold Intolerance, Excessive Hunger, Heat Intolerance and New Diabetes. Hematology Present- Persistent Infections. Not Present- Blood Thinners, Easy Bruising, Excessive bleeding, Gland problems and HIV.  Vitals  Weight: 144.13 lb   Height: 65 in  Body Surface Area: 1.72 m   Body Mass Index: 23.98 kg/m   Temp.: 98.1 F    Pulse: 96 (Regular)    P.OX: 98% (Room air) BP: 136/80(Sitting, Left Arm, Standard)  Physical Exam   GENERAL APPEARANCE Development: normal Nutritional status: normal Gross deformities: none  SKIN Rash, lesions, ulcers: none Induration, erythema: none Nodules: none palpable  EYES Conjunctiva and lids: normal Pupils: equal and reactive Iris: normal bilaterally  EARS, NOSE, MOUTH, THROAT External ears: no lesion or deformity External nose: no lesion or deformity Hearing: grossly normal Due to Covid-19 pandemic, patient is wearing a mask.  NECK Symmetric: no Trachea: slight deviation to left of midline Thyroid: There is a visible mass in the right thyroid lobe. On palpation this measures approximately 5 cm in size. It is smooth, firm,  mobile, and nontender. Left thyroid lobe is normal to palpation. There is no anterior or posterior cervical lymphadenopathy. There are no supraclavicular masses.  CHEST Respiratory effort: normal Retraction or accessory muscle use: no Breath sounds: normal bilaterally Rales, rhonchi, wheeze: none  CARDIOVASCULAR Auscultation: regular rhythm, normal rate Murmurs: none Pulses: radial pulse 2+ palpable Lower extremity edema: none  MUSCULOSKELETAL Station and gait: normal Digits and nails: no clubbing or cyanosis Muscle strength: grossly normal all extremities Range of motion: grossly normal all extremities Deformity: none  LYMPHATIC Cervical: none palpable Supraclavicular: none palpable  PSYCHIATRIC Oriented to person, place, and time: yes Mood and affect: normal for situation Judgment and insight: appropriate for situation    Assessment & Plan   PAPILLARY THYROID CARCINOMA (C73)  Patient presents today at her request for surgical evaluation and management of newly diagnosed papillary thyroid carcinoma.  She is accompanied by her husband.   Patient provided with a copy of "The Thyroid Book: Medical and Surgical Treatment of Thyroid Problems", published by [employer], 16 pages.  Book reviewed and explained to patient during visit today.  Patient has a large approximately 5 cm tumor in the right thyroid lobe resulting in mild tracheal deviation to the left.  Fine-needle aspiration biopsy confirms papillary thyroid carcinoma.  MRI scan shows no evidence of metastatic disease or lymphadenopathy.  We discussed surgery for management of papillary thyroid carcinoma.  We discussed total thyroidectomy versus thyroid lobectomy.  We discussed the pros and cons of each procedure.  We discussed the potential need for radioactive  iodine treatment.  We discussed the risk of the procedure including the risk of recurrent laryngeal nerve injury and injury to parathyroid glands.  We discussed the size  and location of the surgical incision.  We discussed the hospital stay to be anticipated.  We discussed the likely need for lifelong thyroid hormone replacement.  The patient understands and wishes to proceed with total thyroidectomy in the near future.  We would also plan a limited lymph node resection of central compartment lymph nodes for sampling and pathologic evaluation.  Patient will require referral to endocrinology following the procedure and is stated that she would like to see Dr. Philemon Kingdom if possible.  The risks and benefits of the procedure have been discussed at length with the patient.  The patient understands the proposed procedure, potential alternative treatments, and the course of recovery to be expected.  All of the patient's questions have been answered at this time.  The patient wishes to proceed with surgery.  Armandina Gemma, MD [facility] Office: [phone]

## 2021-05-16 ENCOUNTER — Ambulatory Visit (HOSPITAL_COMMUNITY): Payer: BC Managed Care – PPO | Admitting: Physician Assistant

## 2021-05-16 ENCOUNTER — Encounter (HOSPITAL_COMMUNITY): Payer: Self-pay | Admitting: Surgery

## 2021-05-16 ENCOUNTER — Encounter (HOSPITAL_COMMUNITY)
Admission: RE | Disposition: A | Discharge status: Home / Self Care | Payer: Self-pay | Source: Home / Self Care | Attending: Surgery

## 2021-05-16 ENCOUNTER — Ambulatory Visit (HOSPITAL_COMMUNITY): Payer: BC Managed Care – PPO | Admitting: Anesthesiology

## 2021-05-16 ENCOUNTER — Ambulatory Visit (HOSPITAL_COMMUNITY)
Admission: RE | Admit: 2021-05-16 | Discharge: 2021-05-17 | Disposition: A | Discharge status: Home / Self Care | Payer: BC Managed Care – PPO | Attending: Surgery | Admitting: Surgery

## 2021-05-16 ENCOUNTER — Other Ambulatory Visit: Payer: Self-pay

## 2021-05-16 DIAGNOSIS — C7989 Secondary malignant neoplasm of other specified sites: Secondary | ICD-10-CM | POA: Diagnosis not present

## 2021-05-16 DIAGNOSIS — F418 Other specified anxiety disorders: Secondary | ICD-10-CM | POA: Diagnosis not present

## 2021-05-16 DIAGNOSIS — J069 Acute upper respiratory infection, unspecified: Secondary | ICD-10-CM | POA: Diagnosis not present

## 2021-05-16 DIAGNOSIS — Z7951 Long term (current) use of inhaled steroids: Secondary | ICD-10-CM | POA: Diagnosis not present

## 2021-05-16 DIAGNOSIS — C73 Malignant neoplasm of thyroid gland: Secondary | ICD-10-CM | POA: Insufficient documentation

## 2021-05-16 DIAGNOSIS — Z7952 Long term (current) use of systemic steroids: Secondary | ICD-10-CM | POA: Insufficient documentation

## 2021-05-16 DIAGNOSIS — C770 Secondary and unspecified malignant neoplasm of lymph nodes of head, face and neck: Secondary | ICD-10-CM | POA: Diagnosis not present

## 2021-05-16 DIAGNOSIS — K219 Gastro-esophageal reflux disease without esophagitis: Secondary | ICD-10-CM | POA: Diagnosis not present

## 2021-05-16 HISTORY — PX: THYROIDECTOMY: SHX17

## 2021-05-16 LAB — PREGNANCY, URINE: Preg Test, Ur: NEGATIVE

## 2021-05-16 SURGERY — THYROIDECTOMY
Anesthesia: General | Site: Neck

## 2021-05-16 MED ORDER — ROCURONIUM BROMIDE 10 MG/ML (PF) SYRINGE
PREFILLED_SYRINGE | INTRAVENOUS | Status: DC | PRN
  Administered 2021-05-16: 60 mg via INTRAVENOUS
  Administered 2021-05-16: 20 mg via INTRAVENOUS

## 2021-05-16 MED ORDER — GUAIFENESIN ER 600 MG PO TB12
1200.0000 mg | ORAL_TABLET | Freq: Two times a day (BID) | ORAL | Status: DC
  Administered 2021-05-16 – 2021-05-17 (×2): 1200 mg via ORAL
  Filled 2021-05-16 (×2): qty 2

## 2021-05-16 MED ORDER — CHLORHEXIDINE GLUCONATE CLOTH 2 % EX PADS: 6.0000 | MEDICATED_PAD | Freq: Once | CUTANEOUS | Status: DC

## 2021-05-16 MED ORDER — ACETAMINOPHEN 160 MG/5ML PO SOLN: 325.0000 mg | ORAL | Status: DC | PRN

## 2021-05-16 MED ORDER — OLOPATADINE HCL 0.1 % OP SOLN
1.0000 [drp] | Freq: Two times a day (BID) | OPHTHALMIC | Status: DC
  Filled 2021-05-16: qty 5

## 2021-05-16 MED ORDER — ACETAMINOPHEN 10 MG/ML IV SOLN
INTRAVENOUS | Status: AC
  Administered 2021-05-16: 1000 mg via INTRAVENOUS
  Filled 2021-05-16: qty 100

## 2021-05-16 MED ORDER — ONDANSETRON HCL 4 MG/2ML IJ SOLN
INTRAMUSCULAR | Status: DC | PRN
  Administered 2021-05-16: 4 mg via INTRAVENOUS

## 2021-05-16 MED ORDER — BUPROPION HCL ER (XL) 150 MG PO TB24
150.0000 mg | ORAL_TABLET | Freq: Every day | ORAL | Status: DC
  Administered 2021-05-17: 150 mg via ORAL
  Filled 2021-05-16: qty 1

## 2021-05-16 MED ORDER — LACTATED RINGERS IV SOLN: INTRAVENOUS | Status: DC

## 2021-05-16 MED ORDER — MEPERIDINE HCL 50 MG/ML IJ SOLN: 6.2500 mg | INTRAMUSCULAR | Status: DC | PRN

## 2021-05-16 MED ORDER — ACETAMINOPHEN 325 MG PO TABS: 650.0000 mg | ORAL_TABLET | Freq: Four times a day (QID) | ORAL | Status: DC | PRN

## 2021-05-16 MED ORDER — ONDANSETRON HCL 4 MG/2ML IJ SOLN
INTRAMUSCULAR | Status: AC
  Filled 2021-05-16: qty 2

## 2021-05-16 MED ORDER — OXYCODONE HCL 5 MG PO TABS
5.0000 mg | ORAL_TABLET | Freq: Once | ORAL | Status: DC | PRN
Start: 2021-05-16 — End: 2021-05-16

## 2021-05-16 MED ORDER — LISDEXAMFETAMINE DIMESYLATE 30 MG PO CAPS
30.0000 mg | ORAL_CAPSULE | Freq: Every day | ORAL | Status: DC
  Administered 2021-05-17: 30 mg via ORAL
  Filled 2021-05-16: qty 1

## 2021-05-16 MED ORDER — FENTANYL CITRATE (PF) 250 MCG/5ML IJ SOLN
INTRAMUSCULAR | Status: AC
  Filled 2021-05-16: qty 5

## 2021-05-16 MED ORDER — FENTANYL CITRATE (PF) 100 MCG/2ML IJ SOLN
INTRAMUSCULAR | Status: AC
  Administered 2021-05-16: 50 ug via INTRAVENOUS
  Filled 2021-05-16: qty 2

## 2021-05-16 MED ORDER — FENTANYL CITRATE (PF) 100 MCG/2ML IJ SOLN
25.0000 ug | INTRAMUSCULAR | Status: DC | PRN
  Administered 2021-05-16: 50 ug via INTRAVENOUS

## 2021-05-16 MED ORDER — FENTANYL CITRATE (PF) 250 MCG/5ML IJ SOLN
INTRAMUSCULAR | Status: DC | PRN
  Administered 2021-05-16: 100 ug via INTRAVENOUS
  Administered 2021-05-16: 50 ug via INTRAVENOUS
  Administered 2021-05-16 (×3): 100 ug via INTRAVENOUS

## 2021-05-16 MED ORDER — ACETAMINOPHEN 325 MG PO TABS: 325.0000 mg | ORAL_TABLET | ORAL | Status: DC | PRN

## 2021-05-16 MED ORDER — ALPRAZOLAM 0.5 MG PO TABS
0.5000 mg | ORAL_TABLET | Freq: Every day | ORAL | Status: DC | PRN
  Filled 2021-05-16: qty 1

## 2021-05-16 MED ORDER — FENTANYL CITRATE (PF) 100 MCG/2ML IJ SOLN
INTRAMUSCULAR | Status: AC
  Filled 2021-05-16: qty 2

## 2021-05-16 MED ORDER — ACETAMINOPHEN 10 MG/ML IV SOLN: 1000.0000 mg | Freq: Once | INTRAVENOUS | Status: AC

## 2021-05-16 MED ORDER — ROCURONIUM BROMIDE 10 MG/ML (PF) SYRINGE
PREFILLED_SYRINGE | INTRAVENOUS | Status: AC
  Filled 2021-05-16: qty 10

## 2021-05-16 MED ORDER — DEXAMETHASONE SODIUM PHOSPHATE 10 MG/ML IJ SOLN
INTRAMUSCULAR | Status: AC
  Filled 2021-05-16: qty 1

## 2021-05-16 MED ORDER — 0.9 % SODIUM CHLORIDE (POUR BTL) OPTIME
TOPICAL | Status: DC | PRN
  Administered 2021-05-16: 1000 mL

## 2021-05-16 MED ORDER — CALCIUM CARBONATE ANTACID 500 MG PO CHEW: 2.0000 | CHEWABLE_TABLET | Freq: Two times a day (BID) | ORAL | 1 refills | Status: DC

## 2021-05-16 MED ORDER — LEVOTHYROXINE SODIUM 100 MCG PO TABS: 100.0000 ug | ORAL_TABLET | Freq: Every day | ORAL | 2 refills | Status: DC

## 2021-05-16 MED ORDER — CHLORHEXIDINE GLUCONATE 0.12 % MT SOLN
15.0000 mL | Freq: Once | OROMUCOSAL | Status: AC
  Administered 2021-05-16: 15 mL via OROMUCOSAL

## 2021-05-16 MED ORDER — PROPOFOL 10 MG/ML IV BOLUS
INTRAVENOUS | Status: DC | PRN
  Administered 2021-05-16: 160 mg via INTRAVENOUS

## 2021-05-16 MED ORDER — SODIUM CHLORIDE 0.45 % IV SOLN: INTRAVENOUS | Status: DC

## 2021-05-16 MED ORDER — HYDROMORPHONE HCL 1 MG/ML IJ SOLN
1.0000 mg | INTRAMUSCULAR | Status: DC | PRN
Start: 2021-05-16 — End: 2021-05-17
  Administered 2021-05-16 (×2): 1 mg via INTRAVENOUS
  Filled 2021-05-16 (×3): qty 1

## 2021-05-16 MED ORDER — ACETAMINOPHEN 650 MG RE SUPP: 650.0000 mg | Freq: Four times a day (QID) | RECTAL | Status: DC | PRN

## 2021-05-16 MED ORDER — PANTOPRAZOLE SODIUM 40 MG PO TBEC
40.0000 mg | DELAYED_RELEASE_TABLET | Freq: Every day | ORAL | Status: DC
  Filled 2021-05-16: qty 1

## 2021-05-16 MED ORDER — KETOROLAC TROMETHAMINE 15 MG/ML IJ SOLN: 15.0000 mg | Freq: Once | INTRAMUSCULAR | Status: DC

## 2021-05-16 MED ORDER — OXYCODONE HCL 5 MG/5ML PO SOLN: 5.0000 mg | Freq: Once | ORAL | Status: DC | PRN

## 2021-05-16 MED ORDER — IBUPROFEN 400 MG PO TABS
400.0000 mg | ORAL_TABLET | Freq: Four times a day (QID) | ORAL | Status: DC | PRN
  Administered 2021-05-16: 400 mg via ORAL
  Filled 2021-05-16: qty 1

## 2021-05-16 MED ORDER — ACETAMINOPHEN 10 MG/ML IV SOLN: 1000.0000 mg | Freq: Four times a day (QID) | INTRAVENOUS | Status: DC

## 2021-05-16 MED ORDER — TRAMADOL HCL 50 MG PO TABS
50.0000 mg | ORAL_TABLET | Freq: Four times a day (QID) | ORAL | Status: DC | PRN
Start: 2021-05-16 — End: 2021-05-17
  Administered 2021-05-16 – 2021-05-17 (×3): 50 mg via ORAL
  Filled 2021-05-16 (×3): qty 1

## 2021-05-16 MED ORDER — MIDAZOLAM HCL 2 MG/2ML IJ SOLN
INTRAMUSCULAR | Status: DC | PRN
  Administered 2021-05-16: 2 mg via INTRAVENOUS

## 2021-05-16 MED ORDER — SUGAMMADEX SODIUM 200 MG/2ML IV SOLN
INTRAVENOUS | Status: DC | PRN
  Administered 2021-05-16: 150 mg via INTRAVENOUS

## 2021-05-16 MED ORDER — CALCIUM CARBONATE 1250 (500 CA) MG PO TABS
2.0000 | ORAL_TABLET | Freq: Three times a day (TID) | ORAL | Status: DC
  Administered 2021-05-16 – 2021-05-17 (×3): 1000 mg via ORAL
  Filled 2021-05-16 (×3): qty 1

## 2021-05-16 MED ORDER — KETOROLAC TROMETHAMINE 15 MG/ML IJ SOLN
INTRAMUSCULAR | Status: AC
  Filled 2021-05-16: qty 1

## 2021-05-16 MED ORDER — ONDANSETRON HCL 4 MG/2ML IJ SOLN: 4.0000 mg | Freq: Once | INTRAMUSCULAR | Status: DC | PRN

## 2021-05-16 MED ORDER — OXYCODONE HCL 5 MG PO TABS
5.0000 mg | ORAL_TABLET | ORAL | Status: DC | PRN
  Administered 2021-05-16 – 2021-05-17 (×2): 5 mg via ORAL
  Filled 2021-05-16 (×2): qty 1
  Filled 2021-05-16: qty 2

## 2021-05-16 MED ORDER — FLUTICASONE PROPIONATE 50 MCG/ACT NA SUSP
2.0000 | Freq: Every day | NASAL | Status: DC
  Filled 2021-05-16: qty 16

## 2021-05-16 MED ORDER — GUAIFENESIN ER 600 MG PO TB12: 600.0000 mg | ORAL_TABLET | Freq: Two times a day (BID) | ORAL | Status: DC

## 2021-05-16 MED ORDER — LIDOCAINE 2% (20 MG/ML) 5 ML SYRINGE
INTRAMUSCULAR | Status: AC
  Filled 2021-05-16: qty 5

## 2021-05-16 MED ORDER — ONDANSETRON 4 MG PO TBDP: 4.0000 mg | ORAL_TABLET | Freq: Four times a day (QID) | ORAL | Status: DC | PRN

## 2021-05-16 MED ORDER — ORAL CARE MOUTH RINSE: 15.0000 mL | Freq: Once | OROMUCOSAL | Status: AC

## 2021-05-16 MED ORDER — OMEPRAZOLE 20 MG PO CPDR
20.0000 mg | DELAYED_RELEASE_CAPSULE | Freq: Every day | ORAL | Status: DC
  Administered 2021-05-16 – 2021-05-17 (×2): 20 mg via ORAL
  Filled 2021-05-16 (×2): qty 1

## 2021-05-16 MED ORDER — DM-GUAIFENESIN ER 30-600 MG PO TB12
1.0000 | ORAL_TABLET | Freq: Two times a day (BID) | ORAL | Status: DC | PRN
Start: 2021-05-16 — End: 2021-05-16
  Administered 2021-05-16: 1 via ORAL
  Filled 2021-05-16 (×2): qty 1

## 2021-05-16 MED ORDER — SODIUM CHLORIDE 0.9 % IV SOLN
2.0000 g | INTRAVENOUS | Status: AC
  Administered 2021-05-16: 2 g via INTRAVENOUS
  Filled 2021-05-16: qty 2

## 2021-05-16 MED ORDER — HEMOSTATIC AGENTS (NO CHARGE) OPTIME
TOPICAL | Status: DC | PRN
  Administered 2021-05-16: 1

## 2021-05-16 MED ORDER — MIDAZOLAM HCL 2 MG/2ML IJ SOLN
INTRAMUSCULAR | Status: AC
  Filled 2021-05-16: qty 2

## 2021-05-16 MED ORDER — TRAMADOL HCL 50 MG PO TABS: 50.0000 mg | ORAL_TABLET | Freq: Four times a day (QID) | ORAL | 0 refills | Status: DC | PRN

## 2021-05-16 MED ORDER — ONDANSETRON HCL 4 MG/2ML IJ SOLN
4.0000 mg | Freq: Four times a day (QID) | INTRAMUSCULAR | Status: DC | PRN
  Administered 2021-05-16 – 2021-05-17 (×2): 4 mg via INTRAVENOUS
  Filled 2021-05-16 (×2): qty 2

## 2021-05-16 MED ORDER — LIDOCAINE 2% (20 MG/ML) 5 ML SYRINGE
INTRAMUSCULAR | Status: DC | PRN
  Administered 2021-05-16: 60 mg via INTRAVENOUS

## 2021-05-16 MED ORDER — DEXAMETHASONE SODIUM PHOSPHATE 10 MG/ML IJ SOLN
INTRAMUSCULAR | Status: DC | PRN
  Administered 2021-05-16: 5 mg via INTRAVENOUS

## 2021-05-16 MED ORDER — PROPOFOL 10 MG/ML IV BOLUS
INTRAVENOUS | Status: AC
  Filled 2021-05-16: qty 20

## 2021-05-16 SURGICAL SUPPLY — 29 items
ATTRACTOMAT 16X20 MAGNETIC DRP (DRAPES) ×2 IMPLANT
BAG COUNTER SPONGE SURGICOUNT (BAG) IMPLANT
BLADE SURG 15 STRL LF DISP TIS (BLADE) ×1 IMPLANT
BLADE SURG 15 STRL SS (BLADE) ×2
CHLORAPREP W/TINT 26 (MISCELLANEOUS) ×2 IMPLANT
CLIP VESOCCLUDE MED 6/CT (CLIP) ×4 IMPLANT
CLIP VESOCCLUDE SM WIDE 6/CT (CLIP) ×8 IMPLANT
COVER SURGICAL LIGHT HANDLE (MISCELLANEOUS) ×2 IMPLANT
DERMABOND ADVANCED (GAUZE/BANDAGES/DRESSINGS) ×1
DERMABOND ADVANCED .7 DNX12 (GAUZE/BANDAGES/DRESSINGS) ×1 IMPLANT
DRAPE LAPAROTOMY T 98X78 PEDS (DRAPES) ×2 IMPLANT
DRAPE UTILITY XL STRL (DRAPES) ×2 IMPLANT
ELECT PENCIL ROCKER SW 15FT (MISCELLANEOUS) ×2 IMPLANT
ELECT REM PT RETURN 15FT ADLT (MISCELLANEOUS) ×2 IMPLANT
GAUZE 4X4 16PLY ~~LOC~~+RFID DBL (SPONGE) ×2 IMPLANT
GLOVE SURG ORTHO LTX SZ8 (GLOVE) ×2 IMPLANT
GOWN STRL REUS W/TWL XL LVL3 (GOWN DISPOSABLE) ×4 IMPLANT
HEMOSTAT SURGICEL 2X4 FIBR (HEMOSTASIS) ×2 IMPLANT
ILLUMINATOR WAVEGUIDE N/F (MISCELLANEOUS) ×2 IMPLANT
KIT BASIN OR (CUSTOM PROCEDURE TRAY) ×2 IMPLANT
KIT TURNOVER KIT A (KITS) ×2 IMPLANT
PACK BASIC VI WITH GOWN DISP (CUSTOM PROCEDURE TRAY) ×2 IMPLANT
SHEARS HARMONIC 9CM CVD (BLADE) ×2 IMPLANT
SUT MNCRL AB 4-0 PS2 18 (SUTURE) ×2 IMPLANT
SUT VIC AB 3-0 SH 18 (SUTURE) ×4 IMPLANT
SYR BULB IRRIG 60ML STRL (SYRINGE) ×2 IMPLANT
TOWEL OR 17X26 10 PK STRL BLUE (TOWEL DISPOSABLE) ×2 IMPLANT
TOWEL OR NON WOVEN STRL DISP B (DISPOSABLE) ×2 IMPLANT
TUBING CONNECTING 10 (TUBING) ×2 IMPLANT

## 2021-05-16 NOTE — Anesthesia Postprocedure Evaluation (Signed)
Anesthesia Post Note  Patient: Sandra Roberson  Procedure(s) Performed: TOTAL THYROIDECTOMY WITH LIMITED LYMPH NODE DISSECTION (Neck)     Patient location during evaluation: PACU Anesthesia Type: General Level of consciousness: awake and alert Pain management: pain level controlled Vital Signs Assessment: post-procedure vital signs reviewed and stable Respiratory status: spontaneous breathing, nonlabored ventilation, respiratory function stable and patient connected to nasal cannula oxygen Cardiovascular status: blood pressure returned to baseline and stable Postop Assessment: no apparent nausea or vomiting Anesthetic complications: no   No notable events documented.  Last Vitals:  Vitals:   05/16/21 1200 05/16/21 1215  BP: (!) 160/104 (!) 171/78  Pulse: 93 95  Resp: 13 (!) 25  Temp:  36.7 C  SpO2: 99% 100%    Last Pain:  Vitals:   05/16/21 1225  TempSrc:   PainSc: 3                  [name]

## 2021-05-16 NOTE — Interval H&P Note (Signed)
History and Physical Interval Note:  05/16/2021 9:12 AM  Sandra Roberson  has presented today for surgery, with the diagnosis of papillary thyroid carcinoma.  The various methods of treatment have been discussed with the patient and family. After consideration of risks, benefits and other options for treatment, the patient has consented to    Procedure(s): TOTAL THYROIDECTOMY WITH LIMITED LYMPH NODE DISSECTION (N/A) as a surgical intervention.    The patient's history has been reviewed, patient examined, no change in status, stable for surgery.  I have reviewed the patient's chart and labs.  Questions were answered to the patient's satisfaction.    Armandina Gemma, MD [facility] Office: [place]

## 2021-05-16 NOTE — Anesthesia Procedure Notes (Signed)
Procedure Name: Intubation Date/Time: 05/16/2021 9:38 AM Performed by: Lollie Sails, CRNA Pre-anesthesia Checklist: Patient identified, Emergency Drugs available, Suction available, Patient being monitored and Timeout performed Patient Re-evaluated:Patient Re-evaluated prior to induction Oxygen Delivery Method: Circle system utilized Preoxygenation: Pre-oxygenation with 100% oxygen Induction Type: IV induction Ventilation: Mask ventilation without difficulty Laryngoscope Size: Miller and 3 Grade View: Grade I Tube type: Reinforced Tube size: 7.0 mm Number of attempts: 1 Airway Equipment and Method: Stylet Placement Confirmation: ETT inserted through vocal cords under direct vision, positive ETCO2 and breath sounds checked- equal and bilateral Secured at: 22 cm Tube secured with: Tape Dental Injury: Teeth and Oropharynx as per pre-operative assessment

## 2021-05-16 NOTE — Op Note (Signed)
Procedure Note  Pre-operative Diagnosis:  papillary thyroid carcinoma  Post-operative Diagnosis:  same  Surgeon:  Armandina Gemma, MD  Assistant:  Lucas Mallow, MD ([facility] Resident)   Procedure:  Total thyroidectomy with limited central compartment lymph node dissection; reimplantation left inferior parathyroid to sternocleidomastoid muscle (autotransplantation)  Anesthesia:  General  Estimated Blood Loss:  minimal  Drains: none         Specimen: thyroid to pathology  I was personally present during the key and critical portions of this procedure and immediately available throughout the entire procedure, as documented in my operative note.   Indications:  Patient had been undergoing evaluation for chronic sinusitis.  She has had intermittent problems over the past 2-3 years.  She was noted on physical examination by her ENT to have a mass in the right side of the neck.  She was referred for ultrasound and then 4 fine-needle aspiration biopsy which was performed on Mar 20, 2021.  This demonstrated findings consistent with papillary thyroid carcinoma, Bethesda category VI.  Patient subsequently underwent an MRI scan of the neck on April 01, 2021.  This shows a mass in the right lobe of the thyroid measuring 2.6 x 3.2 x 6.4 cm.  There is leftward displacement of the trachea.  Were no abnormal lymph nodes identified.  Patient now presents to discuss thyroid surgery for management of papillary thyroid carcinoma.  Procedure Details: Procedure was done in OR #1 at the [facility]. The patient was brought to the operating room and placed in a supine position on the operating room table. Following administration of general anesthesia, the patient was positioned and then prepped and draped in the usual aseptic fashion. After ascertaining that an adequate level of anesthesia had been achieved, a small Kocher incision was made with #15 blade. Dissection was carried through subcutaneous tissues and  platysma.Hemostasis was achieved with the electrocautery. Skin flaps were elevated cephalad and caudad from the thyroid notch to the sternal notch. A Mahorner self-retaining retractor was placed for exposure. Strap muscles were incised in the midline and dissection was begun on the left side.  Strap muscles were reflected laterally.  Left thyroid lobe was normal in size and without significant nodules.  The left lobe was gently mobilized with blunt dissection. Superior pole vessels were dissected out and divided individually between small and medium ligaclips with the harmonic scalpel. The thyroid lobe was rolled anteriorly. Branches of the inferior thyroid artery were divided between small ligaclips with the harmonic scalpel. Inferior venous tributaries were divided between ligaclips.  The superior parathyroid gland was identified and preserved The recurrent laryngeal nerve was identified and preserved along its course. The ligament of Gwenlyn Found was released with the electrocautery and the gland was mobilized onto the anterior trachea. Isthmus was mobilized across the midline. There was a moderately large pyramidal lobe present which was dissected off of the anterior thyroid cartilage using the electrocautery and resected en bloc with the thyroid isthmus.   At the inferior pole of the left thyroid lobe was a small nodular mass suspicious for parathyroid tissue.  This was sharply excised.  Frozen section biopsy was obtained and confirmed normal parathyroid tissue.  Therefore the remaining parathyroid tissue was morcellated with a #15 blade and then reimplanted into the left sternocleidomastoid muscle by incising the muscle fascia, creating a muscular pocket with a hemostat, inserting the parathyroid tissue, and closing the overlying fascia with a 3-0 Vicryl figure-of-eight suture.  The right thyroid lobe was gently mobilized with  blunt dissection. Right thyroid lobe was markedly enlarged with a dominant central mass  extending up to the superior pole. Superior pole vessels were dissected out and divided between small and medium ligaclips with the Harmonic scalpel. Superior parathyroid was identified and preserved. Inferior venous tributaries were divided between medium ligaclips with the harmonic scalpel. The right thyroid lobe was rolled anteriorly and the branches of the inferior thyroid artery divided between small ligaclips. The right thyroid lobe was mobilized onto the anterior trachea and the remainder of the thyroid was dissected off the anterior trachea and the thyroid was completely excised. A suture was used to mark the right lobe. The entire thyroid gland was submitted to pathology for review.  Lymph node bearing tissue from the central compartment, zone VI, was dissected out using the electrocautery.  Dissection was carried between the carotid arteries and inferiorly to the innominate artery.  Care was taken to avoid parathyroid tissue and the recurrent nerves.  There was at least 1 prominent lymph node just to the right of midline which was included with the specimen.  Specimen was submitted separately to pathology for review.  The neck was irrigated with warm saline. Fibrillar was placed throughout the operative field. Strap muscles were approximated in the midline with interrupted 3-0 Vicryl sutures. Platysma was closed with interrupted 3-0 Vicryl sutures. Skin was closed with a running 4-0 Monocryl subcuticular suture. Wound was washed and Dermabond was applied. The patient was awakened from anesthesia and brought to the recovery room. The patient tolerated the procedure well.   Armandina Gemma, MD [facility] Office: [phone]

## 2021-05-16 NOTE — Transfer of Care (Signed)
Immediate Anesthesia Transfer of Care Note  Patient: Sandra Roberson  Procedure(s) Performed: TOTAL THYROIDECTOMY WITH LIMITED LYMPH NODE DISSECTION (Neck)  Patient Location: PACU  Anesthesia Type:General  Level of Consciousness: awake, alert , oriented and patient cooperative  Airway & Oxygen Therapy: Patient Spontanous Breathing and Patient connected to face mask oxygen  Post-op Assessment: Report given to RN and Post -op Vital signs reviewed and stable  Post vital signs: Reviewed and stable  Last Vitals:  Vitals Value Taken Time  BP 127/102 05/16/21 1140  Temp    Pulse 113 05/16/21 1142  Resp 15 05/16/21 1142  SpO2 100 % 05/16/21 1142  Vitals shown include unvalidated device data.  Last Pain:  Vitals:   05/16/21 0811  TempSrc:   PainSc: 0-No pain         Complications: No notable events documented.

## 2021-05-16 NOTE — Discharge Instructions (Signed)
[facility]  THYROID & PARATHYROID SURGERY:  POST-OP INSTRUCTIONS  Always review your discharge instruction sheet from the facility where your surgery was performed.  A prescription for pain medication may be given to you upon discharge.  Take your pain medication as prescribed.  If narcotic pain medicine is not needed, then you may take acetaminophen (Tylenol) or ibuprofen (Advil) as needed.  Take your usually prescribed medications unless otherwise directed.  If you need a refill on your pain medication, please contact our office during regular business hours.  Prescriptions cannot be processed by our office after 5 pm or on weekends.  Start with a light diet upon arrival home, such as soup and crackers or toast.  Be sure to drink plenty of fluids daily.  Resume your normal diet the day after surgery.  Most patients will experience some swelling and bruising on the chest and neck area.  Ice packs will help.  Swelling and bruising can take several days to resolve.   It is common to experience some constipation after surgery.  Increasing fluid intake and taking a stool softener (Colace) will usually help or prevent this problem.  A mild laxative (Milk of Magnesia or Miralax) should be taken according to package directions if there has been no bowel movement after 48 hours.  You have steri-strips and a gauze dressing over your incision.  You may remove the gauze bandage on the second day after surgery, and you may shower at that time.  Leave your steri-strips (small skin tapes) in place directly over the incision.  These strips should remain on the skin for 5-7 days and then be removed.  You may get them wet in the shower and pat them dry.  You may resume regular (light) daily activities beginning the next day (such as daily self-care, walking, climbing stairs) gradually increasing activities as tolerated.  You may have sexual intercourse when it is comfortable.  Refrain from  any heavy lifting or straining until approved by your doctor.  You may drive when you no longer are taking prescription pain medication, you can comfortably wear a seatbelt, and you can safely maneuver your car and apply brakes.  You should see your doctor in the office for a follow-up appointment approximately three weeks after your surgery.  Make sure that you call for this appointment within a day or two after you arrive home to insure a convenient appointment time.  WHEN TO CALL YOUR DOCTOR: -- Fever greater than 101.5 -- Inability to urinate -- Nausea and/or vomiting - persistent -- Extreme swelling or bruising -- Continued bleeding from incision -- Increased pain, redness, or drainage from the incision -- Difficulty swallowing or breathing -- Muscle cramping or spasms -- Numbness or tingling in hands or around lips  The clinic staff is available to answer your questions during regular business hours.  Please don't hesitate to call and ask to speak to one of the nurses if you have concerns.  Sandra Wingate, MD [facility] Office: [phone] 

## 2021-05-17 ENCOUNTER — Encounter (HOSPITAL_COMMUNITY): Payer: Self-pay | Admitting: Surgery

## 2021-05-17 DIAGNOSIS — C73 Malignant neoplasm of thyroid gland: Secondary | ICD-10-CM | POA: Diagnosis not present

## 2021-05-17 DIAGNOSIS — C770 Secondary and unspecified malignant neoplasm of lymph nodes of head, face and neck: Secondary | ICD-10-CM | POA: Diagnosis not present

## 2021-05-17 DIAGNOSIS — Z7952 Long term (current) use of systemic steroids: Secondary | ICD-10-CM | POA: Diagnosis not present

## 2021-05-17 DIAGNOSIS — Z7951 Long term (current) use of inhaled steroids: Secondary | ICD-10-CM | POA: Diagnosis not present

## 2021-05-17 LAB — BASIC METABOLIC PANEL
Anion gap: 8 (ref 5–15)
BUN: 6 mg/dL (ref 6–20)
CO2: 27 mmol/L (ref 22–32)
Calcium: 8.3 mg/dL — ABNORMAL LOW (ref 8.9–10.3)
Chloride: 95 mmol/L — ABNORMAL LOW (ref 98–111)
Creatinine, Ser: 0.62 mg/dL (ref 0.44–1.00)
GFR, Estimated: >60 mL/min (ref >60)
Glucose, Bld: 114 mg/dL — ABNORMAL HIGH (ref 70–99)
Potassium: 3.5 mmol/L (ref 3.5–5.1)
Sodium: 130 mmol/L — ABNORMAL LOW (ref 135–145)

## 2021-05-17 MED ORDER — ONDANSETRON HCL 4 MG PO TABS: 4.0000 mg | ORAL_TABLET | Freq: Every day | ORAL | 1 refills | Status: DC | PRN

## 2021-05-17 NOTE — Discharge Summary (Signed)
Physician Discharge Summary  Patient ID: Sandra Roberson MRN: [id number] DOB/AGE: [birth year] 45 y.o.  Admit date: 05/16/2021 Discharge date: 05/17/2021  Admission Diagnoses:  Discharge Diagnoses:  Principal Problem:   Papillary thyroid carcinoma [facility])   Discharged Condition: good  Hospital Course: Uneventful postoperative recovery.  On postoperative day 1, she is doing well with mild discomfort.  Voice sounded normal.  Calcium was 8.3.  Pain was controlled.  The decision was made to discharge home  Consults: None  Significant Diagnostic Studies:   Treatments: surgery: Total thyroidectomy  Discharge Exam: Blood pressure 132/77, pulse 60, temperature 97.7 F (36.5 C), temperature source Oral, resp. rate 16, height 5' 4.5" (1.638 m), weight 64 kg, last menstrual period 04/28/2021, SpO2 100 %. General appearance: alert, cooperative, and no distress Voice sounds normal.  No neck hematoma.  Mild ecchymosis only  Disposition: Discharge disposition: 01-Home or Self Care        Allergies as of 05/17/2021       Reactions   Iodinated Diagnostic Agents Rash   Difficult to completely rule out gadolinium as an explanation for the pts rashes after iv dc in mri today (see note 10/12/14) -despite her history/reported history of tolerating contrasted MRI in the past and frequent sensitivity to adhesives instead.   Other Rash        Medication List     TAKE these medications    ALPRAZolam 0.5 MG tablet Commonly known as: XANAX Take 0.5 mg by mouth daily as needed for anxiety or sleep.   Bromelains 500 MG Tabs Take 500 mg by mouth 3 (three) times daily as needed (Drainage).   buPROPion 150 MG 24 hr tablet Commonly known as: WELLBUTRIN XL Take 150 mg by mouth daily.   calcium carbonate 500 MG chewable tablet Commonly known as: Tums Chew 2 tablets (400 mg of elemental calcium total) by mouth 2 (two) times daily.   fluticasone 50 MCG/ACT nasal spray Commonly known as:  FLONASE Place 2 sprays into both nostrils daily.   guaiFENesin 600 MG 12 hr tablet Commonly known as: MUCINEX Take 1,200 mg by mouth daily.   levothyroxine 100 MCG tablet Commonly known as: Synthroid Take 1 tablet (100 mcg total) by mouth daily.   lisdexamfetamine 30 MG capsule Commonly known as: VYVANSE Take 30 mg by mouth every morning. Vyvanse   loratadine 10 MG tablet Commonly known as: CLARITIN Take 10 mg by mouth daily.   Olopatadine HCl 0.2 % Soln Place 1 drop into both eyes daily.   traMADol 50 MG tablet Commonly known as: ULTRAM Take 1-2 tablets (50-100 mg total) by mouth every 6 (six) hours as needed for moderate pain.   XLEAR SINUS CARE SPRAY NA Place 1 spray into the nose daily as needed (help drain).       ASK your doctor about these medications    Azelastine-Fluticasone 137-50 MCG/ACT Susp SHAKE LIQUID AND USE 1 SPRAY IN EACH NOSTRIL TWICE DAILY   Fluticasone-Salmeterol 500-50 MCG/DOSE Aepb Commonly known as: Advair Diskus Inhale 1 puff into the lungs 2 (two) times daily.   omeprazole 20 MG capsule Commonly known as: PRILOSEC TAKE 1 CAPSULE BY MOUTH EVERY DAY        Follow-up Information     Armandina Gemma, MD. Schedule an appointment as soon as possible for a visit in 3 week(s).   Specialty: General Surgery Why: For wound re-check Contact information: [address] [place] [address] [phone]  Philemon Kingdom, MD Follow up.   Specialty: Internal Medicine Why: Appointment requested.  They will call you with date and time. Contact information: [address] [place] [address] [phone]                 Signed: Coralie Keens 05/17/2021, 10:29 AM

## 2021-05-17 NOTE — Progress Notes (Signed)
Assessment unchanged. Pt verbalized understanding of dc instructions through teach back. Discharged via wc to front entrance accompanied by NT and husband.

## 2021-05-19 LAB — SURGICAL PATHOLOGY

## 2021-05-19 NOTE — Progress Notes (Signed)
Pathology with papillary thyroid carcinoma - most common type, most favorable type.  Margins are negative.  Positive lymph nodes in central compartment - will certainly require radioactive iodine treatment.  Will see Dr. Cruzita Lederer to discuss.  Plan RAI treatment in 6-8 weeks.  Will get whole body scan after treatment.  Armandina Gemma, MD [facility] Office: [phone]

## 2021-05-20 DIAGNOSIS — E890 Postprocedural hypothyroidism: Secondary | ICD-10-CM | POA: Diagnosis not present

## 2021-06-03 DIAGNOSIS — R202 Paresthesia of skin: Secondary | ICD-10-CM | POA: Diagnosis not present

## 2021-06-03 DIAGNOSIS — E890 Postprocedural hypothyroidism: Secondary | ICD-10-CM | POA: Diagnosis not present

## 2021-06-09 DIAGNOSIS — R252 Cramp and spasm: Secondary | ICD-10-CM | POA: Diagnosis not present

## 2021-06-09 DIAGNOSIS — K5900 Constipation, unspecified: Secondary | ICD-10-CM | POA: Diagnosis not present

## 2021-06-09 DIAGNOSIS — C73 Malignant neoplasm of thyroid gland: Secondary | ICD-10-CM | POA: Diagnosis not present

## 2021-06-09 DIAGNOSIS — G2581 Restless legs syndrome: Secondary | ICD-10-CM | POA: Diagnosis not present

## 2021-06-11 ENCOUNTER — Encounter: Payer: Self-pay | Admitting: Internal Medicine

## 2021-06-13 ENCOUNTER — Other Ambulatory Visit: Payer: Self-pay

## 2021-06-13 ENCOUNTER — Encounter: Payer: Self-pay | Admitting: Internal Medicine

## 2021-06-13 ENCOUNTER — Ambulatory Visit: Payer: BC Managed Care – PPO | Admitting: Internal Medicine

## 2021-06-13 VITALS — BP 124/86 | HR 82 | Ht 64.0 in | Wt 144.0 lb

## 2021-06-13 DIAGNOSIS — C73 Malignant neoplasm of thyroid gland: Secondary | ICD-10-CM

## 2021-06-13 DIAGNOSIS — E890 Postprocedural hypothyroidism: Secondary | ICD-10-CM | POA: Diagnosis not present

## 2021-06-13 MED ORDER — LEVOTHYROXINE SODIUM 125 MCG PO TABS: 125.0000 ug | ORAL_TABLET | Freq: Every day | ORAL | 3 refills | Status: DC

## 2021-06-13 NOTE — Patient Instructions (Addendum)
  I will order your radioactive iodine treatment to be done at [place]: [phone]. You will need to have a whole-body scan approximately 7 to 10 days afterwards. Please follow a low iodine diet 2 weeks prior to the treatment:  Foods to avoid for two weeks before 131-I scanning and treatment Iodized salt or sea salt, including salty snacks  Milk or other dairy products (small amounts in prepared foods are allowed)  Eggs (small amounts in prepared foods are allowed)  Seafood, especially shellfish, kelp, or seaweed  Any item with added carrageen, agar-agar, algin, or alginates  Cured foods, such as ham, corned beef, and sauerkraut  Breads (usually white breads) made from iodate dough conditioners  Foods and medicine (eg, vitamin-mineral tablets) containing red food dyes  Chocolate  Molasses  Soy products  Restaurant foods and Asian food  [employer]  Foods allowed  Mirant  Potatoes or rice  Wheat or rye bread  Fresh or frozen vegetables  Fresh or frozen fruit  131-I: radioiodine.  Look up: thyca.org website  Please increase levothyroxine to 125 mcg daily.  Take the thyroid hormone every day, with water, at least 30 minutes before breakfast, separated by at least 4 hours from: - acid reflux medications - calcium - iron - multivitamins  We will then need to repeat your thyroid testing 5 to 6 weeks.  Please reduce the calcium-vitamin D to 1 capsule daily.  I will see her back in 3 months.

## 2021-06-13 NOTE — Progress Notes (Signed)
Patient ID: Sandra Roberson, female   DOB: [birth year], 45 y.o.   MRN: [id number]  This visit occurred during the SARS-CoV-2 public health emergency.  Safety protocols were in place, including screening questions prior to the visit, additional usage of staff PPE, and extensive cleaning of exam room while observing appropriate contact time as indicated for disinfecting solutions.   HPI  Sandra Roberson is a 45 y.o.-year-old female, referred by Dr.Gerkin, for management of papillary thyroid cancer and postsurgical hypothyroidism.  Pt. describes that in 2019 she started to have recurrent sinus infections blurry vision, problems swallowing and HAs.  She could not have in office visit during the coronavirus pandemic but she finally saw ENT in 02/2021 >> a neck mass was found >> she had a thyroid U/S >> a large thyroid nodule. She had a biopsy of this nodule showing papillary thyroid cancer.  She had total thyroidectomy with Dr. Harlow Asa approximately 1 month ago.  Margins were negative, however, she had 4 out of 4 lymph node metastases.  She continues to feel poorly after surgery: With significant fatigue, restlessness, fluid retention, mm cramps in her back, tingling on She also has severe constipation, which over the past weekend caused nausea, vomiting with inability to keep food down. She was previously active, exercising in the morning: yoga and running 3 miles daily.  She was not able to do this after the surgery.  Over the weekend, also, her thyroid scar started to hurt a little more.  Reviewed her thyroid cancer history:  03/14/2021: Thyroid ultrasound: Parenchymal Echotexture: Mildly heterogeneous  Isthmus: 0.5 cm Right lobe: 4.9 x 2.5 x 3.6 cm  Left lobe: 3.8 x 1.0 x 1.0 cm  _________________________________________________________   Nodule # 1:  Location: Right; mid  Maximum size: 3.7 cm; Other 2 dimensions: 3.4 x 2.5 cm  Composition: solid/almost completely solid (2)   Echogenicity: hypoechoic (2)  Echogenic foci: punctate echogenic foci (3)  ACR TI-RADS total points: 7.  **Given size (>/= 1.0 cm) and appearance, fine needle aspiration of  this highly suspicious nodule should be considered based on TI-RADS  criteria.  ________________________________________________________   IMPRESSION:  Nodule 1 (TI-RADS 5) meets criteria for FNA.   03/20/2021: Thyroid nodule FNA: Papillary thyroid cancer (Bethesda category VI) A formal molecular marker: Positive for thyroid cancer, with the BRAF V600E C1799 T>A mutation  04/05/2021: MRI brain neck obtained due to dysphagia: Right thyroid nodule with leftward displacement of trachea  05/16/2021: Total thyroidectomy with neck dissection-Dr. Gerkin: A. PARATHYROID, LEFT INFERIOR, BIOPSY:  -  Parathyroid tissue   B. THYROID, TOTAL THYROIDECTOMY:  -  Papillary thyroid carcinoma, 4.7 cm  -  Margins uninvolved by carcinoma  -  See oncology table below   C. LYMPH NODES, CENTRAL COMPARTMENT, ZONE 6, EXCISION:   -  Metastatic carcinoma involving four of four lymph nodes (4/4)   ONCOLOGY TABLE:  Procedure: Thyroidectomy  Tumor Focality: Unifocal  Tumor Site: Right lobe  Tumor Size: 4.7 cm  Histologic Type: Papillary carcinoma, classic  Angioinvasion: Not identified  Lymphatic Invasion: Not identified  Extrathyroidal Extension: Not identified  Margin Status: All margins negative for invasive carcinoma  Regional Lymph Node Status:       Number of Lymph Nodes with Tumor: 4       Nodal Level(s) Involved: Level 6       Size of Largest Metastatic Deposit (cm): 1.1       Extranodal Extension: Cannot be determined       Number of  Lymph Nodes Examined: 4       Nodal Level(s) Examined: Level 6  Distant Metastasis:       Distant Site(s) Involved: Not applicable  Pathologic Stage Classification (pTNM, AJCC 8th Edition): pT3a, pN1a   Pt denies: - feeling nodules in neck - hoarseness - dysphagia - choking - SOB with  lying down  Postsurgical hypothyroidism:  She is on Levothyroxine 100 mcg, taken: - fasting at 5 am - with alarm - with water - separated by >30 min from b'fast  - + calcium (Citracal) slow absorption 2 capsules (1200 mg with vit D ) at 9 am - + Omeprazole 20 mg daily at 5 pm - no iron, multivitamins   I reviewed pt's thyroid tests: 06/09/2021: TSH 14.9 No results found for: TSH, FREET4 1  Postsurgical hypocalcemia: After surgery, she had a slightly low calcium, at 8.3, but these normalized at last check: 06/09/2021: Calcium 9.1 06/03/2021: Calcium 9.2, vitamin D 40.2  She is on 1200 mg Citracal daily with 1000 units vitamin D included.  No FH of thyroid cancer.  No family history of thyroid disease. No h/o radiation tx to head or neck.  ROS: Constitutional: + See HPI, + weight gain, + hot flashes, + fluid retention (hands) Eyes: no blurry vision, no xerophthalmia ENT: no sore throat, + see HPI Cardiovascular: no CP/SOB/palpitations/leg swelling Respiratory: no cough/SOB Gastrointestinal: + Nausea, vomiting, constipation Musculoskeletal: + Muscle aches, + joint pains Skin: no rashes, + hair loss Neurological: no tremors/numbness/tingling/dizziness Psychiatric: no depression/+ anxiety + Low libido  Past Medical History:  Diagnosis Date   Allergy 2018   Alllergies   Anemia varying at times   Anxiety varying at times   Cancer [facility]) may 2022   PTC   GERD (gastroesophageal reflux disease)    Postpartum depression    Thyroid disease May 2022- PTC   Thyroidectomy   Past Surgical History:  Procedure Laterality Date   CESAREAN SECTION  2010   LIVER RESECTION  2015   LIVER RESECTION  09/2013   THYROIDECTOMY N/A 05/16/2021   Procedure: TOTAL THYROIDECTOMY WITH LIMITED LYMPH NODE DISSECTION;  Surgeon: Armandina Gemma, MD;  Location: [facility];  Service: General;  Laterality: N/A;   Social History   Socioeconomic History   Marital status: Married    Spouse name: Not on file    Number of children: Not on file   Years of education: Not on file   Highest education level: Not on file  Occupational History   Not on file  Tobacco Use   Smoking status: Never   Smokeless tobacco: Never  Vaping Use   Vaping Use: Never used  Substance and Sexual Activity   Alcohol use: Yes    Alcohol/week: 6.0 standard drinks    Types: 6 Glasses of wine per week    Comment: Socially   Drug use: No   Sexual activity: Yes    Birth control/protection: Other-see comments    Comment: Husband- Vesictomy  Other Topics Concern   Not on file  Social History Narrative   Not on file   Social Determinants of Health   Financial Resource Strain: Not on file  Food Insecurity: Not on file  Transportation Needs: Not on file  Physical Activity: Not on file  Stress: Not on file  Social Connections: Not on file  Intimate Partner Violence: Not on file   Current Outpatient Medications on File Prior to Visit  Medication Sig Dispense Refill   ALPRAZolam (XANAX) 0.5 MG tablet Take  0.5 mg by mouth daily as needed for anxiety or sleep.     Bromelains 500 MG TABS Take 500 mg by mouth 3 (three) times daily as needed (Drainage).     buPROPion (WELLBUTRIN XL) 150 MG 24 hr tablet Take 150 mg by mouth daily.     calcium carbonate (TUMS) 500 MG chewable tablet Chew 2 tablets (400 mg of elemental calcium total) by mouth 2 (two) times daily. 90 tablet 1   fluticasone (FLONASE) 50 MCG/ACT nasal spray Place 2 sprays into both nostrils daily.     guaiFENesin (MUCINEX) 600 MG 12 hr tablet Take 1,200 mg by mouth daily.     levothyroxine (SYNTHROID) 100 MCG tablet Take 1 tablet (100 mcg total) by mouth daily. 30 tablet 2   lisdexamfetamine (VYVANSE) 30 MG capsule Take 30 mg by mouth every morning. Vyvanse     loratadine (CLARITIN) 10 MG tablet Take 10 mg by mouth daily.     Olopatadine HCl 0.2 % SOLN Place 1 drop into both eyes daily.     omeprazole (PRILOSEC) 20 MG capsule TAKE 1 CAPSULE BY MOUTH EVERY DAY  (Patient taking differently: Take 20 mg by mouth daily.) 30 capsule 5   Sodium Chloride-Xylitol (XLEAR SINUS CARE SPRAY NA) Place 1 spray into the nose daily as needed (help drain).     No current facility-administered medications on file prior to visit.   Allergies  Allergen Reactions   Iodinated Diagnostic Agents Rash    Difficult to completely rule out gadolinium as an explanation for the pts rashes after iv dc in mri today (see note 10/12/14) -despite her history/reported history of tolerating contrasted MRI in the past and frequent sensitivity to adhesives instead.    Other Rash   Family History  Problem Relation Age of Onset   Hypertension Mother    GER disease Father    Breast cancer Maternal Grandmother    Congestive Heart Failure Maternal Grandfather    PE: BP 124/86   Pulse 82   Ht '5\' 4"'  (1.626 m)   Wt 144 lb (65.3 kg)   SpO2 99%   BMI 24.72 kg/m  Wt Readings from Last 3 Encounters:  06/13/21 144 lb (65.3 kg)  05/16/21 141 lb 3 oz (64 kg)  05/09/21 141 lb 3 oz (64 kg)   Constitutional: overweight, in NAD Eyes: PERRLA, EOMI, no exophthalmos ENT: moist mucous membranes, no neck masses palpated, no cervical lymphadenopathy Cardiovascular: RRR, No MRG Respiratory: CTA B Gastrointestinal: abdomen soft, NT, ND, BS+ Musculoskeletal: no deformities, strength intact in all 4 Skin: moist, warm, no rashes Neurological: no tremor with outstretched hands, DTR normal in all 4  ASSESSMENT: 1.  Papillary thyroid cancer - see HPI  2. Postsurgical Hypothyroidism  PLAN:  1.  Papillary thyroid cancer - with positive BRAF V600E 504 [id number] T>A mutation - I had a long discussion with the patient about hypothyroid cancer diagnosis.  We reviewed together the pathology with the patient and her husband.  She is clinically stage 1 TNM due to age, despite the large tumor and lymph node metastasis. - I reassured her that papillary thyroid cancer is a slow growing cancer with good  prognosis. We discussed about the BRAF mutation as confering her higher risk for recurrence, however, this is very common among patients with papillary thyroid cancer (between 54 and 80% incidence, depending on the study) and new studies show that he can increase mortality and recurrence mostly in patients older than 45 years old. - since the tumor  was large and with lymph node metastases, radioactive iodine is indicated for post-op thyroid remnant ablation. I explained that the main role of this is to facilitate monitoring in the long run (by checking thyroglobulin). I explained what this entails.  We will do this with Thyrogen stimulation, rather than hormonal withdrawal.  I explained that a whole-body scan will be done afterwards to check for any metastasis or local extension.  Pt agrees to have these tests scheduled.  I advised her about RAI treatment: Benefits, possible side effects, low iodine diet for 2 weeks prior to the procedure -given written materials about this - At next visit, we will check a thyroglobulin and ATA antibodies.  This will serve as a baseline.  The trend, rather than the absolute value is valuable we will follow the thyroglobulin. - I will then see the patient in approximately 3 months  2.  Patient with h/o total thyroidectomy for cancer, now with iatrogenic hypothyroidism, on levothyroxine therapy.  - she complains of constipation and fatigue and several other symptoms including tingling and muscle aches - a TSH obtained 4 days ago was elevated, at 14.9 - Therefore, for now, we need to increase her levothyroxine from 100 to 125 mcg daily - target TSH for her: At the lower limit of normal or slightly lower, due to her metastatic thyroid cancer - We discussed about correct intake of levothyroxine, fasting, with water, separated by at least 30 minutes from breakfast, and separated by more than 4 hours from calcium, iron, multivitamins, acid reflux medications (PPIs).  Pt. is  taking it correctly. - We will recheck her tests again in 5 to 6 weeks  3.  Postsurgical hypocalcemia -Calcium was slightly low right after surgery but this improved at the beginning of the month and again 4 days ago -9.1 -As of now, she is still on calcium 1200 milligrams daily which I believe is causing significant constipation >> advised her to decrease the dose to 600 mg daily and I plan to start after we check the next calcium level if this is normal - on 1000 units vit D daily, but this was started approximately around the same time when her previous vitamin D level was checked - latest vit D was 40, normal - will advise her to reduce calcium to 600 units (vit D decrease to 500 units)  Patient Instructions   I will order your radioactive iodine treatment to be done at [place]: [phone]. You will need to have a whole-body scan approximately 7 to 10 days afterwards. Please follow a low iodine diet 2 weeks prior to the treatment:  Foods to avoid for two weeks before 131-I scanning and treatment Iodized salt or sea salt, including salty snacks  Milk or other dairy products (small amounts in prepared foods are allowed)  Eggs (small amounts in prepared foods are allowed)  Seafood, especially shellfish, kelp, or seaweed  Any item with added carrageen, agar-agar, algin, or alginates  Cured foods, such as ham, corned beef, and sauerkraut  Breads (usually white breads) made from iodate dough conditioners  Foods and medicine (eg, vitamin-mineral tablets) containing red food dyes  Chocolate  Molasses  Soy products  Restaurant foods and Asian food  [employer]  Foods allowed  [employer]  Potatoes or rice  Wheat or rye bread  Fresh or frozen vegetables  Fresh or frozen fruit  131-I: radioiodine.  Look up: thyca.org website  Please increase levothyroxine to 125 mcg daily.  Take  the thyroid hormone every day, with water, at least 30 minutes before breakfast,  separated by at least 4 hours from: - acid reflux medications - calcium - iron - multivitamins  We will then need to repeat your thyroid testing 5 to 6 weeks.  Please reduce the calcium-vitamin D to 1 capsule daily.  I will see her back in 3 months.  - Total time spent for the visit: 1 hour, in obtaining medical information from the chart and from the patient and her husband, reviewing her  previous labs, imaging evaluations, and treatments, reviewing her symptoms, counseling her about her conditions (please see the discussed topics above), and developing a plan to further investigate and treat them; she had a number of questions which I addressed.   Philemon Kingdom, MD PhD [facility] Endocrin[facility]

## 2021-06-17 ENCOUNTER — Telehealth: Payer: Self-pay | Admitting: Internal Medicine

## 2021-06-17 NOTE — Telephone Encounter (Signed)
Pt was supposed to have iodine treatment through the IV and a full body scan. Pt has not been contacted to make an app , as well as on my chart it states that it has already been completed. Pt would like a call back regarding this so pt will know what to do

## 2021-06-19 NOTE — Telephone Encounter (Signed)
Awaiting follow up regarding scheduling and referring paperwork.

## 2021-06-20 ENCOUNTER — Encounter: Payer: Self-pay | Admitting: Internal Medicine

## 2021-06-21 NOTE — Telephone Encounter (Signed)
Referral coordination to follow up.

## 2021-06-25 NOTE — Telephone Encounter (Signed)
I called patient and advised that insurance portion had been taken care of and that I was just waiting for a call back from [place].  Also let her know that as soon as I hear back I will be in touch with her by phone.

## 2021-06-30 ENCOUNTER — Encounter: Payer: Self-pay | Admitting: Internal Medicine

## 2021-07-08 NOTE — Written Directive (Addendum)
MOLECULAR IMAGING AND THERAPEUTICS WRITTEN DIRECTIVE   PATIENT NAME: Sandra Roberson  PT DOB:   [birth year]                                              MRN: [id number]  ---------------------------------------------------------------------------------------------------------------------   I-131 THYROID CANCER THERAPY   RADIOPHARMACEUTICAL:  Iodine-131 Capsule    PRESCRIBED DOSE FOR ADMINISTRATION: 125 mCi (one hundred and twenty five)    ROUTE OFADMINISTRATION:  PO   DIAGNOSIS:Thyroid Cancer   REFERRING PHYSICIAN:Gherghe,Cristina   THYROGEN STIMULATION OR HORMONE WITHDRAW: Thyrogen   REMNANT ABLATION OR ADJUVANT THERAPY:   DATE OF THYROIDECTOMY:05/16/2021   SURGEON:Gerkin,Todd   TSH:   No results found for: TSH   PRIOR I-131 THERAPY (Date and Dose):   Pathology:  Cell type: _0   Papillary  <IOEVOJJKKXFGHWEX>_9<\/BZJIRCVELFYBOFBP>_1   Follicular  <WCHENIDPOEUMPNTI>_1<\/WERXVQMGQQPYPPJK>_9   [place]   Largest tumor focus: 4.7     cm  Extrathyroidal Extension?     Yes _3   No _4     Lymphovascular Invasion?  Yes  _5   No  _6     Margins positive ? Yes _7   No _8     Lymph nodes positive? Yes _9   No  _10       # positive nodes:  4 # negative nodes:  0   TNM staging: pT:  3a       PN:    1a     Mx:    ADDITIONAL PHYSICIAN COMMENTS/NOTES: PTC with high risk features (size and postive LNs).  Young patient with negative margins   AUTHORIZED USER SIGNATURE & TIME STAMP: Rennis Golden, MD   07/08/21    2:48 PM

## 2021-07-14 DIAGNOSIS — C73 Malignant neoplasm of thyroid gland: Secondary | ICD-10-CM | POA: Diagnosis not present

## 2021-07-14 DIAGNOSIS — R5383 Other fatigue: Secondary | ICD-10-CM | POA: Diagnosis not present

## 2021-07-14 DIAGNOSIS — R252 Cramp and spasm: Secondary | ICD-10-CM | POA: Diagnosis not present

## 2021-07-14 DIAGNOSIS — E785 Hyperlipidemia, unspecified: Secondary | ICD-10-CM | POA: Diagnosis not present

## 2021-07-14 NOTE — Written Directive (Cosign Needed)
MOLECULAR IMAGING AND THERAPEUTICS WRITTEN DIRECTIVE   PATIENT NAME: Sandra Roberson  PT DOB:   August 13, 1976                                              MRN: [id number]  ---------------------------------------------------------------------------------------------------------------------   I-131 THYROID CANCER THERAPY   RADIOPHARMACEUTICAL:  Iodine-131 Capsule    PRESCRIBED DOSE FOR ADMINISTRATION:    ROUTE OFADMINISTRATION:  PO   DIAGNOSIS:   REFERRING PHYSICIAN:Gherghe   THYROGEN STIMULATION OR HORMONE WITHDRAW:thyrogen   REMNANT ABLATION OR ADJUVANT THERAPY:   DATE OF THYROIDECTOMY:05/16/2021   SURGEON:Gerkin   TSH:   No results found for: TSH   PRIOR I-131 THERAPY (Date and Dose):   Pathology:  Cell type: '[]'$   Papillary  '[]'$   Follicular  '[]'$   [place]   Largest tumor focus:      cm  Extrathyroidal Extension?     Yes '[]'$   No '[]'$     Lymphovascular Invasion?  Yes  '[]'$   No  '[]'$     Margins positive ? Yes '[]'$   No '[]'$     Lymph nodes positive? Yes '[]'$   No  '[]'$       # positive nodes:   # negative nodes:     TNM staging: pT:         PN:         Mx:    ADDITIONAL PHYSICIAN COMMENTS/NOTES   AUTHORIZED USER SIGNATURE & TIME STAMP:

## 2021-07-14 NOTE — Written Directive (Addendum)
MOLECULAR IMAGING AND THERAPEUTICS WRITTEN DIRECTIVE   PATIENT NAME: Sandra Roberson  PT DOB:   February 03, 1976                                              MRN: [id number]  ---------------------------------------------------------------------------------------------------------------------   I-131 THYROID CANCER THERAPY   RADIOPHARMACEUTICAL: 125.0 Iodine-131 Capsule    PRESCRIBED DOSE FOR ADMINISTRATION: 125.0 Iodine-131 Capsule    ROUTE OFADMINISTRATION:  PO   DIAGNOSIS: Papillary Thyroid Cancer   REFERRING PHYSICIAN:Gherghe   THYROGEN STIMULATION OR HORMONE WITHDRAW:Thyrogen   REMNANT ABLATION OR ADJUVANT THERAPY:   DATE OF THYROIDECTOMY:05/16/2021   SURGEON:Gerkin   TSH:   No results found for: TSH   PRIOR I-131 THERAPY (Date and Dose):   Pathology:  Cell type: '[x]'$   Papillary  '[]'$   Follicular  '[]'$   [place]   Largest tumor focus:      cm  Extrathyroidal Extension?     Yes '[]'$   No '[x]'$     Lymphovascular Invasion?  Yes  '[]'$   No  '[x]'$     Margins positive ? Yes '[]'$   No '[x]'$     Lymph nodes positive? Yes '[x]'$   No  '[]'$       # positive nodes:  4 # negative nodes:  0   TNM staging: pT:  3a       PN:   1a      Mx:    ADDITIONAL PHYSICIAN COMMENTS/NOTES   AUTHORIZED USER SIGNATURE & TIME STAMP:  Angelita Ingles [phone] hours 07/15/21

## 2021-07-18 ENCOUNTER — Other Ambulatory Visit: Payer: BC Managed Care – PPO

## 2021-07-18 NOTE — Telephone Encounter (Signed)
Patient called to advise that she had labs done with PCP and that her Thyroid labs were low. PCP adjusted medicine but patient would like imput from this

## 2021-07-21 ENCOUNTER — Telehealth: Payer: Self-pay | Admitting: Internal Medicine

## 2021-07-21 NOTE — Telephone Encounter (Signed)
Pt has injections 10/5-7 and needs works notes stating she won't make it in to work, but cleared for remote work from home and can return Tuesday 10/11. Any questions [phone]

## 2021-07-24 ENCOUNTER — Encounter: Payer: Self-pay | Admitting: Internal Medicine

## 2021-07-28 NOTE — Telephone Encounter (Signed)
Patient called follow up on getting letter for her job to allow for her to work from home, letter would need to advise when she is having treatment and how long she will have to not be around people because of radiation.  Please call patient at [phone]

## 2021-07-28 NOTE — Telephone Encounter (Signed)
Pt contacted via phone and advised she needs to contact Nuclear Medicine at [phone] regarding return to work letter.   Appointments:   Wednesday July 30, 2021 @ 8:00 AM for injection Thursday July 31, 2021 @ 8:00 AM for injection Friday August 01, 2021 @ 12:00 noon for treatment   Monday August 11, 2021 @  8:00 AM for whole body scan.

## 2021-07-28 NOTE — Telephone Encounter (Signed)
Patient also asking when she should do lab work

## 2021-07-28 NOTE — Telephone Encounter (Signed)
Called and advised pt that letter would need to be provided by the office she is going to for the injections Nuclear Medicine office number provided to pt to follow up.

## 2021-07-29 ENCOUNTER — Encounter: Payer: Self-pay | Admitting: Internal Medicine

## 2021-07-30 ENCOUNTER — Ambulatory Visit (HOSPITAL_COMMUNITY)
Admission: RE | Admit: 2021-07-30 | Discharge: 2021-07-30 | Disposition: A | Discharge status: Home / Self Care | Payer: BC Managed Care – PPO | Source: Ambulatory Visit | Attending: Internal Medicine | Admitting: Internal Medicine

## 2021-07-30 ENCOUNTER — Other Ambulatory Visit: Payer: Self-pay

## 2021-07-30 DIAGNOSIS — C73 Malignant neoplasm of thyroid gland: Secondary | ICD-10-CM | POA: Insufficient documentation

## 2021-07-30 MED ORDER — THYROTROPIN ALFA 0.9 MG IM SOLR
0.9000 mg | INTRAMUSCULAR | Status: AC
  Administered 2021-07-30: 0.9 mg via INTRAMUSCULAR

## 2021-07-30 MED ORDER — STERILE WATER FOR INJECTION IJ SOLN
INTRAMUSCULAR | Status: AC
  Filled 2021-07-30: qty 10

## 2021-07-31 ENCOUNTER — Ambulatory Visit (HOSPITAL_COMMUNITY)
Admission: RE | Admit: 2021-07-31 | Discharge: 2021-07-31 | Disposition: A | Discharge status: Home / Self Care | Payer: BC Managed Care – PPO | Source: Ambulatory Visit | Attending: Internal Medicine | Admitting: Internal Medicine

## 2021-07-31 DIAGNOSIS — C73 Malignant neoplasm of thyroid gland: Secondary | ICD-10-CM | POA: Diagnosis not present

## 2021-07-31 MED ORDER — STERILE WATER FOR INJECTION IJ SOLN
INTRAMUSCULAR | Status: AC
  Filled 2021-07-31: qty 10

## 2021-07-31 MED ORDER — ONDANSETRON HCL 4 MG PO TABS: 4.0000 mg | ORAL_TABLET | Freq: Three times a day (TID) | ORAL | 0 refills | Status: DC | PRN

## 2021-07-31 MED ORDER — THYROTROPIN ALFA 0.9 MG IM SOLR
0.9000 mg | INTRAMUSCULAR | Status: AC
  Administered 2021-07-31: 0.9 mg via INTRAMUSCULAR

## 2021-08-01 ENCOUNTER — Ambulatory Visit (HOSPITAL_COMMUNITY)
Admission: RE | Admit: 2021-08-01 | Discharge: 2021-08-01 | Disposition: A | Discharge status: Home / Self Care | Payer: BC Managed Care – PPO | Source: Ambulatory Visit | Attending: Internal Medicine | Admitting: Internal Medicine

## 2021-08-01 ENCOUNTER — Other Ambulatory Visit: Payer: Self-pay

## 2021-08-01 ENCOUNTER — Inpatient Hospital Stay (HOSPITAL_COMMUNITY): Admission: RE | Admit: 2021-08-01 | Payer: BC Managed Care – PPO | Source: Ambulatory Visit

## 2021-08-01 DIAGNOSIS — C73 Malignant neoplasm of thyroid gland: Secondary | ICD-10-CM | POA: Diagnosis not present

## 2021-08-01 LAB — HCG, SERUM, QUALITATIVE: Preg, Serum: NEGATIVE

## 2021-08-01 MED ORDER — SODIUM IODIDE I 131 CAPSULE
120.9000 | Freq: Once | INTRAVENOUS | Status: AC | PRN
  Administered 2021-08-01: 120.9 via ORAL

## 2021-08-11 ENCOUNTER — Other Ambulatory Visit: Payer: Self-pay

## 2021-08-11 ENCOUNTER — Other Ambulatory Visit (HOSPITAL_COMMUNITY): Payer: BC Managed Care – PPO

## 2021-08-11 ENCOUNTER — Ambulatory Visit (HOSPITAL_COMMUNITY)
Admission: RE | Admit: 2021-08-11 | Discharge: 2021-08-11 | Disposition: A | Discharge status: Home / Self Care | Payer: BC Managed Care – PPO | Source: Ambulatory Visit | Attending: Internal Medicine | Admitting: Internal Medicine

## 2021-08-11 DIAGNOSIS — Z79899 Other long term (current) drug therapy: Secondary | ICD-10-CM | POA: Insufficient documentation

## 2021-08-11 DIAGNOSIS — C73 Malignant neoplasm of thyroid gland: Secondary | ICD-10-CM | POA: Insufficient documentation

## 2021-09-05 ENCOUNTER — Other Ambulatory Visit: Payer: Self-pay

## 2021-09-05 ENCOUNTER — Other Ambulatory Visit (INDEPENDENT_AMBULATORY_CARE_PROVIDER_SITE_OTHER): Payer: BC Managed Care – PPO

## 2021-09-05 DIAGNOSIS — E890 Postprocedural hypothyroidism: Secondary | ICD-10-CM

## 2021-09-05 NOTE — Addendum Note (Signed)
Addended by: Kaylyn Lim I on: 09/05/2021 03:44 PM   Modules accepted: Orders

## 2021-09-06 LAB — CALCIUM: Calcium: 8.1 mg/dL — ABNORMAL LOW (ref 8.7–10.2)

## 2021-09-06 LAB — T4, FREE: Free T4: 1.62 ng/dL (ref 0.82–1.77)

## 2021-09-06 LAB — TSH: TSH: 1.72 u[IU]/mL (ref 0.450–4.500)

## 2021-09-12 ENCOUNTER — Other Ambulatory Visit: Payer: Self-pay

## 2021-09-12 ENCOUNTER — Encounter: Payer: Self-pay | Admitting: Internal Medicine

## 2021-09-12 ENCOUNTER — Ambulatory Visit (INDEPENDENT_AMBULATORY_CARE_PROVIDER_SITE_OTHER): Payer: BC Managed Care – PPO | Admitting: Internal Medicine

## 2021-09-12 VITALS — BP 128/88 | HR 87 | Ht 64.0 in | Wt 144.0 lb

## 2021-09-12 DIAGNOSIS — E890 Postprocedural hypothyroidism: Secondary | ICD-10-CM

## 2021-09-12 DIAGNOSIS — C73 Malignant neoplasm of thyroid gland: Secondary | ICD-10-CM | POA: Diagnosis not present

## 2021-09-12 MED ORDER — LEVOTHYROXINE SODIUM 150 MCG PO TABS: 150.0000 ug | ORAL_TABLET | Freq: Every day | ORAL | 3 refills | Status: DC

## 2021-09-12 NOTE — Progress Notes (Signed)
Patient ID: Sandra Roberson, female   DOB: 05/10/1976, 45 y.o.   MRN: [id number]  This visit occurred during the SARS-CoV-2 public health emergency.  Safety protocols were in place, including screening questions prior to the visit, additional usage of staff PPE, and extensive cleaning of exam room while observing appropriate contact time as indicated for disinfecting solutions.   HPI  Sandra Roberson is a 45 y.o.-year-old female, initially referred by Dr.Gerkin, returning for follow-up for papillary thyroid cancer and postsurgical hypothyroidism.  Last visit 3 months ago. She is here with her husband.  Interim history: She had a hard time after her RAI treatment with fatigue, muscle cramps, overall feeling poorly.  She started a multivitamin (w/o calcium) and also a pre and probiotic supplement at night. Since last visit, approximately 2 months ago, she also tells me that her PCP checked a TSH and this was elevated and he increased her levothyroxine dose to 150 mcg daily.  She has been on this dose for approximately 2 months.  She is feeling a little better on this.  Reviewed history: Pt. describes that in 2019 she started to have recurrent sinus infections blurry vision, problems swallowing and HAs.  She could not have in office visit during the coronavirus pandemic but she finally saw ENT in 02/2021 >> a neck mass was found >> she had a thyroid U/S >> a large thyroid nodule. She had a biopsy of this nodule showing papillary thyroid cancer.  She had total thyroidectomy with Dr. Harlow Asa approximately 1 month ago.  Margins were negative, however, she had 4 out of 4 lymph node metastases.  At last visit she felt poorly: Fatigue, restlessness, fluid retention, muscle cramps in her back, tingling, severe constipation with nausea and vomiting and inability to keep food down.  We reduced the dose of her calcium.  We also increase her levothyroxine dose.  She was previously active, exercising in  the morning: yoga and running 3 miles daily.  She was not able to do this after the surgery.  Reviewed previous investigation and treatment:  03/14/2021: Thyroid ultrasound: Parenchymal Echotexture: Mildly heterogeneous  Isthmus: 0.5 cm Right lobe: 4.9 x 2.5 x 3.6 cm  Left lobe: 3.8 x 1.0 x 1.0 cm  _________________________________________________________   Nodule # 1:  Location: Right; mid  Maximum size: 3.7 cm; Other 2 dimensions: 3.4 x 2.5 cm  Composition: solid/almost completely solid (2)  Echogenicity: hypoechoic (2)  Echogenic foci: punctate echogenic foci (3)  ACR TI-RADS total points: 7.  **Given size (>/= 1.0 cm) and appearance, fine needle aspiration of  this highly suspicious nodule should be considered based on TI-RADS  criteria.  ________________________________________________________   IMPRESSION:  Nodule 1 (TI-RADS 5) meets criteria for FNA.   03/20/2021: Thyroid nodule FNA: Papillary thyroid cancer (Bethesda category VI) A formal molecular marker: Positive for thyroid cancer, with the BRAF [id number] T>A mutation  04/05/2021: MRI brain neck obtained due to dysphagia: Right thyroid nodule with leftward displacement of trachea  05/16/2021: Total thyroidectomy with neck dissection-Dr. Gerkin: A. PARATHYROID, LEFT INFERIOR, BIOPSY:  -  Parathyroid tissue   B. THYROID, TOTAL THYROIDECTOMY:  -  Papillary thyroid carcinoma, 4.7 cm  -  Margins uninvolved by carcinoma  -  See oncology table below   C. LYMPH NODES, CENTRAL COMPARTMENT, ZONE 6, EXCISION:   -  Metastatic carcinoma involving four of four lymph nodes (4/4)   ONCOLOGY TABLE:  Procedure: Thyroidectomy  Tumor Focality: Unifocal  Tumor Site: Right lobe  Tumor  Size: 4.7 cm  Histologic Type: Papillary carcinoma, classic  Angioinvasion: Not identified  Lymphatic Invasion: Not identified  Extrathyroidal Extension: Not identified  Margin Status: All margins negative for invasive carcinoma  Regional Lymph  Node Status:       Number of Lymph Nodes with Tumor: 4       Nodal Level(s) Involved: Level 6       Size of Largest Metastatic Deposit (cm): 1.1       Extranodal Extension: Cannot be determined       Number of Lymph Nodes Examined: 4       Nodal Level(s) Examined: Level 6  Distant Metastasis:       Distant Site(s) Involved: Not applicable  Pathologic Stage Classification (pTNM, AJCC 8th Edition): pT3a, pN1a   08/01/2021: I131 120.9 mCi  08/11/2021: Posttreatment WBS: No distant metastases or remnant disease in the neck  Pt denies: - feeling nodules in neck - hoarseness - dysphagia - choking - SOB with lying down  Postsurgical hypothyroidism:  She is on Levothyroxine 125 mcg + 1/4 of the 100 mcg (~150 mcg daily - per PCP), taken: - fasting at 5 am - with alarm - goes back to sleep - with water - separated by >30 min from b'fast  - + calcium (Citracal) slow absorption at lunchtime - + Omeprazole 20 mg daily at 5 pm - no iron - + multivitamins midday  I reviewed pt's thyroid tests: Lab Results  Component Value Date   TSH 1.720 09/05/2021   FREET4 1.62 09/05/2021  06/09/2021: TSH 14.9  Postsurgical hypocalcemia:  After surgery, she had a slightly low calcium, at 8.3, which then normalized, but latest level was also low: Lab Results  Component Value Date   CALCIUM 8.1 (L) 09/05/2021   CALCIUM 8.3 (L) 05/17/2021   CALCIUM 9.9 02/02/2020  06/09/2021: Calcium 9.1 06/03/2021: Calcium 9.2, vitamin D 40.2  She is on 600 mg Citracal daily.  Vitamin D in MVI. No results found for: VD25OH  No FH of thyroid cancer.  No family history of thyroid disease. No h/o radiation tx to head or neck except for RAI treatment.  ROS: + see HPI  Past Medical History:  Diagnosis Date   Allergy 2018   Alllergies   Anemia varying at times   Anxiety varying at times   Cancer [facility]) may 2022   PTC   GERD (gastroesophageal reflux disease)    Postpartum depression    Thyroid disease May  2022- PTC   Thyroidectomy   Past Surgical History:  Procedure Laterality Date   CESAREAN SECTION  2010   LIVER RESECTION  2015   LIVER RESECTION  09/2013   THYROIDECTOMY N/A 05/16/2021   Procedure: TOTAL THYROIDECTOMY WITH LIMITED LYMPH NODE DISSECTION;  Surgeon: Armandina Gemma, MD;  Location: [facility];  Service: General;  Laterality: N/A;   Social History   Socioeconomic History   Marital status: Married    Spouse name: Not on file   Number of children: 1   Years of education: Not on file   Highest education level: Not on file  Occupational History   Occupation: Artist  Tobacco Use   Smoking status: Never   Smokeless tobacco: Never  Vaping Use   Vaping Use: Never used  Substance and Sexual Activity   Alcohol use: Yes    Alcohol/week: 6.0 standard drinks    Types: 6 Glasses of wine per week    Comment: Socially   Drug use: No  Sexual activity: Yes    Birth control/protection: Other-see comments    Comment: Husband- Vesictomy  Other Topics Concern   Not on file  Social History Narrative   Not on file   Social Determinants of Health   Financial Resource Strain: Not on file  Food Insecurity: Not on file  Transportation Needs: Not on file  Physical Activity: Not on file  Stress: Not on file  Social Connections: Not on file  Intimate Partner Violence: Not on file   Current Outpatient Medications on File Prior to Visit  Medication Sig Dispense Refill   ALPRAZolam (XANAX) 0.5 MG tablet Take 0.5 mg by mouth daily as needed for anxiety or sleep.     Bromelains 500 MG TABS Take 500 mg by mouth 3 (three) times daily as needed (Drainage).     buPROPion (WELLBUTRIN XL) 150 MG 24 hr tablet Take 150 mg by mouth daily.     fluticasone (FLONASE) 50 MCG/ACT nasal spray Place 2 sprays into both nostrils daily.     guaiFENesin (MUCINEX) 600 MG 12 hr tablet Take 1,200 mg by mouth daily.     levothyroxine (SYNTHROID) 125 MCG tablet Take 1 tablet  (125 mcg total) by mouth daily. 45 tablet 3   lisdexamfetamine (VYVANSE) 30 MG capsule Take 30 mg by mouth every morning. Vyvanse     loratadine (CLARITIN) 10 MG tablet Take 10 mg by mouth daily.     Olopatadine HCl 0.2 % SOLN Place 1 drop into both eyes daily.     omeprazole (PRILOSEC) 20 MG capsule TAKE 1 CAPSULE BY MOUTH EVERY DAY (Patient taking differently: Take 20 mg by mouth daily.) 30 capsule 5   ondansetron (ZOFRAN) 4 MG tablet Take 1 tablet (4 mg total) by mouth every 8 (eight) hours as needed for nausea or vomiting. 15 tablet 0   Sodium Chloride-Xylitol (XLEAR SINUS CARE SPRAY NA) Place 1 spray into the nose daily as needed (help drain).     No current facility-administered medications on file prior to visit.   Allergies  Allergen Reactions   Other Rash   Family History  Problem Relation Age of Onset   Hypertension Mother    ADD / ADHD Mother    Varicose Veins Mother    GER disease Father    Alcohol abuse Father    Breast cancer Maternal Grandmother    Cancer Maternal Grandmother    COPD Maternal Grandmother    Congestive Heart Failure Maternal Grandfather    Cancer Maternal Grandfather    Learning disabilities Maternal Grandfather    Early death Paternal Grandfather    Early death Paternal 49    ADD / ADHD Brother    Learning disabilities Brother    Varicose Veins Brother    Cancer Maternal 26 / Stillbirths Sister    PE: There were no vitals taken for this visit. Wt Readings from Last 3 Encounters:  06/13/21 144 lb (65.3 kg)  05/16/21 141 lb 3 oz (64 kg)  05/09/21 141 lb 3 oz (64 kg)   Constitutional: overweight, in NAD Eyes: PERRLA, EOMI, no exophthalmos ENT: moist mucous membranes, no neck masses palpated, no cervical lymphadenopathy Cardiovascular: RRR, No MRG Respiratory: CTA B Gastrointestinal: abdomen soft, NT, ND, BS+ Musculoskeletal: no deformities, strength intact in all 4 Skin: moist, warm, no rashes Neurological: no  tremor with outstretched hands, DTR normal in all 4  ASSESSMENT: 1.  Papillary thyroid cancer - see HPI  2. Postsurgical Hypothyroidism  PLAN:  1.  Papillary thyroid cancer - with positive BRAF V600E [id number] T>A mutation -At last visit, I had a long discussion with the patient about hypothyroid cancer diagnosis.  We reviewed together the pathology with the patient and her husband.  She is clinically stage 1 TNM due to age, despite the large tumor and lymph node metastasis. - I reassured her that papillary thyroid cancer is a slow-growing cancer with good prognosis. We discussed about the BRAF mutation as confering her higher risk for recurrence, however, this is very common among patients with papillary thyroid cancer (between 55 and 80% incidence, depending on the study) and new studies show that he can increase mortality and recurrence mostly in patients older than 45 years old. -Since the tumor was large with lymph node metastasis, RAI treatment was indicated for postop thyroid remnant ablation. -Since last visit, she had RAI treatment plan 05/14/2021 and then posttreatment whole-body scan on 08/18/2021.  There were no sites of metastasis observed on the scan. -At next blood draw, we will check a thyroglobulin and ATA antibodies.  Again discussed about the use of thyroglobulin in screening for cancer recurrence. - I will then see the patient in 4 months  2.  Patient with h/o total thyroidectomy for cancer, now with iatrogenic hypothyroidism, on levothyroxine therapy -At last visit she had fatigue, constipation, and several other symptoms including tingling and muscle aches.  The TSH was high, at 14.9.  We increased her levothyroxine dose.  Approximately 2 months ago, she had labs by PCP (did not have these levels) and levothyroxine dose was increased to 150 mcg daily. - latest thyroid labs reviewed with pt. >> normal: Lab Results  Component Value Date   TSH 1.720 09/05/2021  - she continues  on LT4 150 mcg daily - pt feels good on this dose. - we discussed about taking the thyroid hormone every day, with water, >30 minutes before breakfast, separated by >4 hours from acid reflux medications, calcium, iron, multivitamins. Pt. is taking it correctly.  She prefers to wake up around 5 AM with an alarm to take levothyroxine. - we are targeting a lower TSH due to her history of cancer, so at next lab draw, we may need to increase the dose of levothyroxine if the TSH is still not around the lower limit of normal -We will recheck her TFTs again in 5 to 6 weeks  3.  Postsurgical hypocalcemia -Calcium was slightly low right after surgery but improved to normal before last visit -At last visit, I advised her to reduce her calcium intake from 1200 mg daily to only 600 mg daily because of constipation -However, on the above dose, recent calcium was slightly low, at 8.1 -Of note, her vitamin D level was normal at 40 -She continues on calcium supplement and vitamin D3 in multivitamins -No perioral numbness or a controlled cramping, but she does have calf muscle cramps -At today's visit, we discussed about adding another calcium tablet a day but may need to take Colace.  She already started a probiotic/prebiotic supplement. -I will recheck her ionized calcium at the next blood draw in 5 to 6 weeks  Orders Placed This Encounter  Procedures   TSH   T4, free   Calcium, ionized   Vitamin D, 25-hydroxy   - Total time spent for the visit: 40 min, in obtaining medical information from the chart and from the patient, reviewing her  previous labs, imaging evaluations, and treatments, reviewing her symptoms, counseling her about her conditions (please see  the discussed topics above), and developing a plan to further investigate and treat them; she had a number of questions which I addressed.  Philemon Kingdom, MD PhD [facility] - Inpatient Endocrinology

## 2021-09-12 NOTE — Patient Instructions (Addendum)
Please continue Levothyroxine to 150 mg daily.  Take the thyroid hormone every day, with water, at least 30 minutes before breakfast, separated by at least 4 hours from: - acid reflux medications - calcium - iron - multivitamins  Please increase calcium to 2x a day.  Come back for labs in 1.5 months.  Please come back for a follow-up appointment in 4 months.

## 2021-10-30 ENCOUNTER — Other Ambulatory Visit: Payer: Self-pay

## 2021-10-30 ENCOUNTER — Other Ambulatory Visit (INDEPENDENT_AMBULATORY_CARE_PROVIDER_SITE_OTHER): Payer: BC Managed Care – PPO

## 2021-10-30 DIAGNOSIS — E890 Postprocedural hypothyroidism: Secondary | ICD-10-CM | POA: Diagnosis not present

## 2021-10-31 ENCOUNTER — Encounter: Payer: Self-pay | Admitting: Internal Medicine

## 2021-10-31 LAB — CALCIUM, IONIZED: Calcium, Ion: 4.77 mg/dL — ABNORMAL LOW (ref 4.8–5.6)

## 2021-10-31 LAB — VITAMIN D 25 HYDROXY (VIT D DEFICIENCY, FRACTURES): VITD: 28.03 ng/mL — ABNORMAL LOW (ref 30.00–100.00)

## 2021-10-31 LAB — T4, FREE: Free T4: 1.38 ng/dL (ref 0.60–1.60)

## 2021-10-31 LAB — TSH: TSH: 0.05 u[IU]/mL — ABNORMAL LOW (ref 0.35–5.50)

## 2021-10-31 MED ORDER — LEVOTHYROXINE SODIUM 137 MCG PO TABS: 137.0000 ug | ORAL_TABLET | Freq: Every day | ORAL | 3 refills | Status: DC

## 2021-12-08 ENCOUNTER — Encounter: Payer: Self-pay | Admitting: Internal Medicine

## 2021-12-08 ENCOUNTER — Other Ambulatory Visit: Payer: Self-pay | Admitting: Internal Medicine

## 2021-12-08 DIAGNOSIS — E890 Postprocedural hypothyroidism: Secondary | ICD-10-CM

## 2021-12-08 DIAGNOSIS — C73 Malignant neoplasm of thyroid gland: Secondary | ICD-10-CM

## 2021-12-09 ENCOUNTER — Other Ambulatory Visit: Payer: Self-pay | Admitting: Internal Medicine

## 2021-12-09 ENCOUNTER — Other Ambulatory Visit: Payer: Self-pay

## 2021-12-09 ENCOUNTER — Other Ambulatory Visit (INDEPENDENT_AMBULATORY_CARE_PROVIDER_SITE_OTHER): Payer: BC Managed Care – PPO

## 2021-12-09 DIAGNOSIS — E890 Postprocedural hypothyroidism: Secondary | ICD-10-CM | POA: Diagnosis not present

## 2021-12-09 DIAGNOSIS — R5382 Chronic fatigue, unspecified: Secondary | ICD-10-CM | POA: Diagnosis not present

## 2021-12-09 DIAGNOSIS — C73 Malignant neoplasm of thyroid gland: Secondary | ICD-10-CM

## 2021-12-09 LAB — VITAMIN B12: Vitamin B-12: 93 pg/mL — ABNORMAL LOW (ref 211–911)

## 2021-12-09 LAB — TSH: TSH: 0.14 u[IU]/mL — ABNORMAL LOW (ref 0.35–5.50)

## 2021-12-09 LAB — T4, FREE: Free T4: 1.08 ng/dL (ref 0.60–1.60)

## 2021-12-09 LAB — VITAMIN D 25 HYDROXY (VIT D DEFICIENCY, FRACTURES): VITD: 38.23 ng/mL (ref 30.00–100.00)

## 2021-12-10 ENCOUNTER — Ambulatory Visit (INDEPENDENT_AMBULATORY_CARE_PROVIDER_SITE_OTHER): Payer: BC Managed Care – PPO

## 2021-12-10 ENCOUNTER — Encounter: Payer: Self-pay | Admitting: Internal Medicine

## 2021-12-10 DIAGNOSIS — E538 Deficiency of other specified B group vitamins: Secondary | ICD-10-CM | POA: Diagnosis not present

## 2021-12-10 LAB — CALCIUM, IONIZED: Calcium, Ion: 4.7 mg/dL — ABNORMAL LOW (ref 4.8–5.6)

## 2021-12-10 MED ORDER — CYANOCOBALAMIN 1000 MCG/ML IJ SOLN
1000.0000 ug | Freq: Once | INTRAMUSCULAR | Status: AC
  Administered 2021-12-10: 1000 ug via INTRAMUSCULAR

## 2021-12-10 NOTE — Progress Notes (Signed)
Pt verbally consented to injection of B-12. Administered to pts right outer upper quadrant. Pt tolerated well.

## 2021-12-19 ENCOUNTER — Other Ambulatory Visit: Payer: Self-pay

## 2021-12-19 ENCOUNTER — Ambulatory Visit (INDEPENDENT_AMBULATORY_CARE_PROVIDER_SITE_OTHER): Payer: BC Managed Care – PPO

## 2021-12-19 DIAGNOSIS — E538 Deficiency of other specified B group vitamins: Secondary | ICD-10-CM

## 2021-12-19 MED ORDER — CYANOCOBALAMIN 1000 MCG/ML IJ SOLN
1000.0000 ug | Freq: Once | INTRAMUSCULAR | Status: AC
  Administered 2021-12-19: 1000 ug via INTRAMUSCULAR

## 2021-12-19 NOTE — Progress Notes (Signed)
Patient verbally confirmed name, date of birth, and correct medication to be administered. B-12 injection administered and pt tolerated well.  

## 2021-12-23 ENCOUNTER — Encounter: Payer: Self-pay | Admitting: Internal Medicine

## 2021-12-26 ENCOUNTER — Ambulatory Visit (INDEPENDENT_AMBULATORY_CARE_PROVIDER_SITE_OTHER): Payer: BC Managed Care – PPO

## 2021-12-26 ENCOUNTER — Other Ambulatory Visit: Payer: Self-pay

## 2021-12-26 DIAGNOSIS — E538 Deficiency of other specified B group vitamins: Secondary | ICD-10-CM | POA: Diagnosis not present

## 2021-12-26 MED ORDER — CYANOCOBALAMIN 1000 MCG/ML IJ SOLN
1000.0000 ug | Freq: Once | INTRAMUSCULAR | Status: AC
  Administered 2021-12-26: 1000 ug via INTRAMUSCULAR

## 2021-12-26 NOTE — Progress Notes (Signed)
Patient verbally confirmed name, date of birth, and correct medication to be administered. B-12 injection administered and pt tolerated well.  

## 2022-01-02 ENCOUNTER — Other Ambulatory Visit: Payer: Self-pay

## 2022-01-02 ENCOUNTER — Ambulatory Visit (INDEPENDENT_AMBULATORY_CARE_PROVIDER_SITE_OTHER): Payer: BC Managed Care – PPO

## 2022-01-02 DIAGNOSIS — E538 Deficiency of other specified B group vitamins: Secondary | ICD-10-CM

## 2022-01-02 MED ORDER — CYANOCOBALAMIN 1000 MCG/ML IJ SOLN
1000.0000 ug | Freq: Once | INTRAMUSCULAR | Status: AC
  Administered 2022-01-02: 1000 ug via INTRAMUSCULAR

## 2022-01-02 NOTE — Progress Notes (Signed)
Patient verbally confirmed name, date of birth, and correct medication to be administered. B-12 injection administered and pt tolerated well.  

## 2022-01-08 ENCOUNTER — Ambulatory Visit: Payer: BC Managed Care – PPO | Admitting: Internal Medicine

## 2022-01-08 ENCOUNTER — Encounter: Payer: Self-pay | Admitting: Internal Medicine

## 2022-01-08 ENCOUNTER — Other Ambulatory Visit: Payer: Self-pay

## 2022-01-08 VITALS — BP 148/90 | HR 79 | Ht 64.0 in | Wt 146.0 lb

## 2022-01-08 DIAGNOSIS — E890 Postprocedural hypothyroidism: Secondary | ICD-10-CM

## 2022-01-08 DIAGNOSIS — R7989 Other specified abnormal findings of blood chemistry: Secondary | ICD-10-CM

## 2022-01-08 DIAGNOSIS — C73 Malignant neoplasm of thyroid gland: Secondary | ICD-10-CM | POA: Diagnosis not present

## 2022-01-08 DIAGNOSIS — E538 Deficiency of other specified B group vitamins: Secondary | ICD-10-CM | POA: Diagnosis not present

## 2022-01-08 MED ORDER — CYANOCOBALAMIN 1000 MCG/ML IJ SOLN
1000.0000 ug | Freq: Once | INTRAMUSCULAR | Status: AC
  Administered 2022-01-08: 1000 ug via INTRAMUSCULAR

## 2022-01-08 NOTE — Progress Notes (Signed)
Patient ID: Sandra Roberson, female   DOB: 04-13-1976, 46 y.o.   MRN: [id number] ? ?This visit occurred during the SARS-CoV-2 public health emergency.  Safety protocols were in place, including screening questions prior to the visit, additional usage of staff PPE, and extensive cleaning of exam room while observing appropriate contact time as indicated for disinfecting solutions.  ? ?HPI  ?Sandra Roberson is a 46 y.o.-year-old female, initially referred by Dr.Gerkin, returning for follow-up for papillary thyroid cancer and postsurgical hypothyroidism.  Last visit 4 months ago. ? ?Interim history: ?Patient continues to feel poorly after her RAI treatment with fatigue, generalized muscle aches, tingling, numbness, constant headaches, feeling poorly overall. ?She has severe neck muscle aches since her RAI treatment and had a cervical spine x-ray.  This showed osteoarthritis. She may need to see neurology.  She was started on Celebrex. ?At today's visit, she still feels poorly despite starting IM B12 after our last visit when her vitamin B12 was extremely low. ?She sent me a message 2 days ago that she would like to start on brand-name Synthroid instead of generic levothyroxine. ?  ?Reviewed history: ?Pt. describes that in 2019 she started to have recurrent sinus infections blurry vision, problems swallowing and HAs.  She could not have in office visit during the coronavirus pandemic but she finally saw ENT in 02/2021 >> a neck mass was found >> she had a thyroid U/S >> a large thyroid nodule. She had a biopsy of this nodule showing papillary thyroid cancer.  She had total thyroidectomy with Dr. Harlow Asa approximately 1 month ago.  Margins were negative, however, she had 4 out of 4 lymph node metastases. ? ?At last visit she felt poorly: Fatigue, restlessness, fluid retention, muscle cramps in her back, tingling, severe constipation with nausea and vomiting and inability to keep food down.  We reduced the dose  of her calcium.  We also increase her levothyroxine dose. ? ?She was previously active, exercising in the morning: yoga and running 3 miles daily.  She was not able to do this after the surgery. ? ?Reviewed previous investigation and treatment: ? ?03/14/2021: Thyroid ultrasound: ?Parenchymal Echotexture: Mildly heterogeneous  ?Isthmus: 0.5 cm ?Right lobe: 4.9 x 2.5 x 3.6 cm  ?Left lobe: 3.8 x 1.0 x 1.0 cm  ?_________________________________________________________  ? ?Nodule # 1:  ?Location: Right; mid  ?Maximum size: 3.7 cm; Other 2 dimensions: 3.4 x 2.5 cm  ?Composition: solid/almost completely solid (2)  ?Echogenicity: hypoechoic (2)  ?Echogenic foci: punctate echogenic foci (3)  ?ACR TI-RADS total points: 7.  ?**Given size (>/= 1.0 cm) and appearance, fine needle aspiration of  ?this highly suspicious nodule should be considered based on TI-RADS  ?criteria.  ?________________________________________________________  ? ?IMPRESSION:  ?Nodule 1 (TI-RADS 5) meets criteria for FNA.  ? ?03/20/2021: Thyroid nodule FNA: Papillary thyroid cancer ([employer] category VI) ?A formal molecular marker: Positive for thyroid cancer, with the BRAF [id number] [id number] T>A mutation ? ?04/05/2021: MRI brain neck obtained due to dysphagia: Right thyroid nodule with leftward displacement of trachea ? ?05/16/2021: Total thyroidectomy with neck dissection-Dr. Harlow Asa: ?A. PARATHYROID, LEFT INFERIOR, BIOPSY:  ?-  Parathyroid tissue  ? ?B. THYROID, TOTAL THYROIDECTOMY:  ?-  Papillary thyroid carcinoma, 4.7 cm  ?-  Margins uninvolved by carcinoma  ?-  See oncology table below  ? ?C. LYMPH NODES, CENTRAL COMPARTMENT, ZONE 6, EXCISION:  ? -  Metastatic carcinoma involving four of four lymph nodes (4/4)  ? ?ONCOLOGY TABLE:  ?Procedure: Thyroidectomy  ?Tumor Focality:  Unifocal  ?Tumor Site: Right lobe  ?Tumor Size: 4.7 cm  ?Histologic Type: Papillary carcinoma, classic  ?Angioinvasion: Not identified  ?Lymphatic Invasion: Not identified  ?Extrathyroidal  Extension: Not identified  ?Margin Status: All margins negative for invasive carcinoma  ?Regional Lymph Node Status:  ?     Number of Lymph Nodes with Tumor: 4  ?     Nodal Level(s) Involved: Level 6  ?     Size of Largest Metastatic Deposit (cm): 1.1  ?     Extranodal Extension: Cannot be determined  ?     Number of Lymph Nodes Examined: 4  ?     Nodal Level(s) Examined: Level 6  ?Distant Metastasis:  ?     Distant Site(s) Involved: Not applicable  ?Pathologic Stage Classification (pTNM, AJCC 8th Edition): pT3a, pN1a  ? ?08/01/2021: I131 120.9 mCi ? ?08/11/2021: Posttreatment WBS: No distant metastases or remnant disease in the neck ? ?No results found for: THYROGLB ?No results found for: THGAB ? ?Pt denies: ?- feeling nodules in neck ?- hoarseness ?- dysphagia ?- choking ?- SOB with lying down ? ?Postsurgical hypothyroidism: ? ?She is on Levothyroxine 137 mcg daily (dose decreased 10/2021), taken: ?- fasting at 5 am - with alarm - goes back to sleep ?- with water ?- separated by >30 min from b'fast  ?- + calcium (Citracal) slow absorption at lunchtime ?- + Omeprazole 20 mg daily at 5 pm ?- no iron ?- + multivitamins midday ? ?I reviewed pt's thyroid tests: ?Lab Results  ?Component Value Date  ? TSH 0.14 (L) 12/09/2021  ? TSH 0.05 (L) 10/30/2021  ? TSH 1.720 09/05/2021  ? FREET4 1.08 12/09/2021  ? FREET4 1.38 10/30/2021  ? FREET4 1.62 09/05/2021  ?06/09/2021: TSH 14.9 ? ?Postsurgical hypocalcemia: ? ?Reviewed calcium levels: ?12/23/2021: Calcium 8.7, magnesium 2.1, both normal ?12/09/2021: Ionized calcium 4.7 (4.8-5.6) ?Lab Results  ?Component Value Date  ? CALCIUM 8.1 (L) 09/05/2021  ? CALCIUM 8.3 (L) 05/17/2021  ? CALCIUM 9.9 02/02/2020  ?06/09/2021: Calcium 9.1 ?06/03/2021: Calcium 9.2, vitamin D 40.2 ? ?She is on 600 mg Citracal with vitamin D BID -increased 08/2021. ? ?Vitamin D insufficiency: ?Reviewed vitamin D levels: ?01/05/2022: Vitamin D 25.3 ?Lab Results  ?Component Value Date  ? VD25OH 38.23 12/09/2021  ?  VD25OH 28.03 (L) 10/30/2021  ?She takes vitamin D in MVIs and Citracal. Also, PCP added 50,000 units Ergocalciferol once a week.  ? ?No FH of thyroid cancer.  No family history of thyroid disease. ?No h/o radiation tx to head or neck except for RAI treatment. ? ?At last visit, during investigation for fatigue, she was found to have severe B12 deficiency: ?12/23/2021: B12 481 ?Lab Results  ?Component Value Date  ? VITAMINB12 93 (L) 12/09/2021  ? ?ROS:  ?+ see HPI ? ?Past Medical History:  ?Diagnosis Date  ? Allergy 2018  ? Alllergies  ? Anemia varying at times  ? Anxiety varying at times  ? Cancer [facility]) may 2022  ? PTC  ? GERD (gastroesophageal reflux disease)   ? Postpartum depression   ? Thyroid disease May 2022- PTC  ? Thyroidectomy  ? ?Past Surgical History:  ?Procedure Laterality Date  ? CESAREAN SECTION  2010  ? LIVER RESECTION  2015  ? LIVER RESECTION  09/2013  ? THYROIDECTOMY N/A 05/16/2021  ? Procedure: TOTAL THYROIDECTOMY WITH LIMITED LYMPH NODE DISSECTION;  Surgeon: Armandina Gemma, MD;  Location: [facility];  Service: General;  Laterality: N/A;  ? ?Social History  ? ?Socioeconomic History  ?  Marital status: Married  ?  Spouse name: Not on file  ? Number of children: 1  ? Years of education: Not on file  ? Highest education level: Not on file  ?Occupational History  ? Occupation: Artist  ?Tobacco Use  ? Smoking status: Never  ? Smokeless tobacco: Never  ?Vaping Use  ? Vaping Use: Never used  ?Substance and Sexual Activity  ? Alcohol use: Yes  ?  Alcohol/week: 6.0 standard drinks  ?  Types: 6 Glasses of wine per week  ?  Comment: Socially  ? Drug use: No  ? Sexual activity: Yes  ?  Birth control/protection: Other-see comments  ?  Comment: Husband- Vesictomy  ?Other Topics Concern  ? Not on file  ?Social History Narrative  ? Not on file  ? ?Social Determinants of Health  ? ?Financial Resource Strain: Not on file  ?Food Insecurity: Not on file  ?Transportation Needs: Not on  file  ?Physical Activity: Not on file  ?Stress: Not on file  ?Social Connections: Not on file  ?Intimate Partner Violence: Not on file  ? ?Current Outpatient Medications on File Prior to Visit  ?Medication Sig D

## 2022-01-08 NOTE — Progress Notes (Signed)
Patient verbally confirmed name, date of birth, and correct medication to be administered. B-12 injection administered and pt tolerated well.  

## 2022-01-08 NOTE — Patient Instructions (Addendum)
Please stop at the lab. ? ?Please continue Levothyroxine 137 mg daily. ? ?Take the thyroid hormone every day, with water, at least 30 minutes before breakfast, separated by at least 4 hours from: ?- acid reflux medications ?- calcium ?- iron ?- multivitamins ? ?Continue calcium 2x a day. Continue Ergocalciferol. ? ?Continue B12 im every 1 week for another month. ? ?Please come back for a follow-up appointment in 4 months. ? ?

## 2022-01-09 LAB — T4, FREE: Free T4: 0.89 ng/dL (ref 0.60–1.60)

## 2022-01-09 LAB — TSH: TSH: 0.6 u[IU]/mL (ref 0.35–5.50)

## 2022-01-09 MED ORDER — SYNTHROID 137 MCG PO TABS: 137.0000 ug | ORAL_TABLET | Freq: Every day | ORAL | 5 refills | Status: DC

## 2022-01-10 LAB — PARATHYROID HORMONE, INTACT (NO CA): PTH: 27 pg/mL (ref 15–65)

## 2022-01-12 LAB — THYROGLOBULIN LEVEL: Thyroglobulin: 0.2 ng/mL — ABNORMAL LOW

## 2022-01-12 LAB — THYROGLOBULIN ANTIBODY: Thyroglobulin Ab: <1 IU/mL (ref <=1)

## 2022-01-12 LAB — CALCIUM, IONIZED: Calcium, Ion: 4.7 mg/dL (ref 4.7–5.5)

## 2022-01-16 ENCOUNTER — Ambulatory Visit (INDEPENDENT_AMBULATORY_CARE_PROVIDER_SITE_OTHER): Payer: BC Managed Care – PPO

## 2022-01-16 ENCOUNTER — Other Ambulatory Visit: Payer: Self-pay

## 2022-01-16 DIAGNOSIS — E538 Deficiency of other specified B group vitamins: Secondary | ICD-10-CM

## 2022-01-16 MED ORDER — CYANOCOBALAMIN 1000 MCG/ML IJ SOLN
1000.0000 ug | Freq: Once | INTRAMUSCULAR | Status: AC
  Administered 2022-01-16: 1000 ug via INTRAMUSCULAR

## 2022-01-16 NOTE — Progress Notes (Signed)
Patient verbally confirmed name, date of birth, and correct medication to be administered. B-12 injection administered and pt tolerated well.  

## 2022-01-23 ENCOUNTER — Ambulatory Visit (INDEPENDENT_AMBULATORY_CARE_PROVIDER_SITE_OTHER): Payer: BC Managed Care – PPO

## 2022-01-23 DIAGNOSIS — E538 Deficiency of other specified B group vitamins: Secondary | ICD-10-CM | POA: Diagnosis not present

## 2022-01-23 MED ORDER — CYANOCOBALAMIN 1000 MCG/ML IJ SOLN
1000.0000 ug | Freq: Once | INTRAMUSCULAR | Status: AC
  Administered 2022-01-23: 1000 ug via INTRAMUSCULAR

## 2022-01-23 NOTE — Progress Notes (Signed)
Patient verbally confirmed name, date of birth, and correct medication to be administered. B-12 injection administered and pt tolerated well.  

## 2022-01-28 ENCOUNTER — Ambulatory Visit (INDEPENDENT_AMBULATORY_CARE_PROVIDER_SITE_OTHER): Payer: BC Managed Care – PPO

## 2022-01-28 DIAGNOSIS — E538 Deficiency of other specified B group vitamins: Secondary | ICD-10-CM | POA: Diagnosis not present

## 2022-01-28 MED ORDER — CYANOCOBALAMIN 1000 MCG/ML IJ SOLN
1000.0000 ug | Freq: Once | INTRAMUSCULAR | Status: AC
  Administered 2022-01-28: 1000 ug via INTRAMUSCULAR

## 2022-01-28 NOTE — Progress Notes (Signed)
Patient verbally confirmed name, date of birth, and correct medication to be administered. B-12 injection administered and pt tolerated well.  

## 2022-02-10 ENCOUNTER — Ambulatory Visit (INDEPENDENT_AMBULATORY_CARE_PROVIDER_SITE_OTHER): Payer: BC Managed Care – PPO

## 2022-02-10 DIAGNOSIS — E538 Deficiency of other specified B group vitamins: Secondary | ICD-10-CM | POA: Diagnosis not present

## 2022-02-10 MED ORDER — CYANOCOBALAMIN 1000 MCG/ML IJ SOLN
1000.0000 ug | Freq: Once | INTRAMUSCULAR | Status: AC
  Administered 2022-02-10: 1000 ug via INTRAMUSCULAR

## 2022-02-10 NOTE — Progress Notes (Signed)
Patient verbally confirmed name, date of birth, and correct medication to be administered. B-12 injection administered and pt tolerated well.  

## 2022-02-25 ENCOUNTER — Other Ambulatory Visit: Payer: Self-pay | Admitting: Physician Assistant

## 2022-02-25 DIAGNOSIS — C73 Malignant neoplasm of thyroid gland: Secondary | ICD-10-CM

## 2022-02-25 DIAGNOSIS — M5412 Radiculopathy, cervical region: Secondary | ICD-10-CM

## 2022-03-08 ENCOUNTER — Encounter: Payer: Self-pay | Admitting: Internal Medicine

## 2022-03-10 ENCOUNTER — Ambulatory Visit (INDEPENDENT_AMBULATORY_CARE_PROVIDER_SITE_OTHER): Payer: BC Managed Care – PPO

## 2022-03-10 DIAGNOSIS — E538 Deficiency of other specified B group vitamins: Secondary | ICD-10-CM

## 2022-03-10 MED ORDER — CYANOCOBALAMIN 1000 MCG/ML IJ SOLN
1000.0000 ug | Freq: Once | INTRAMUSCULAR | Status: AC
  Administered 2022-03-10: 1000 ug via INTRAMUSCULAR

## 2022-03-10 NOTE — Progress Notes (Signed)
Patient verbally confirmed name, date of birth, and correct medication to be administered. B-12 injection administered and pt tolerated well.  

## 2022-03-13 ENCOUNTER — Ambulatory Visit
Admission: RE | Admit: 2022-03-13 | Discharge: 2022-03-13 | Disposition: A | Discharge status: Home / Self Care | Payer: BC Managed Care – PPO | Source: Ambulatory Visit | Attending: Physician Assistant | Admitting: Physician Assistant

## 2022-03-13 ENCOUNTER — Ambulatory Visit: Payer: BC Managed Care – PPO

## 2022-03-13 DIAGNOSIS — C73 Malignant neoplasm of thyroid gland: Secondary | ICD-10-CM

## 2022-03-13 DIAGNOSIS — M5412 Radiculopathy, cervical region: Secondary | ICD-10-CM

## 2022-03-13 MED ORDER — GADOBENATE DIMEGLUMINE 529 MG/ML IV SOLN
12.0000 mL | Freq: Once | INTRAVENOUS | Status: AC | PRN
  Administered 2022-03-13: 12 mL via INTRAVENOUS

## 2022-03-17 ENCOUNTER — Ambulatory Visit (INDEPENDENT_AMBULATORY_CARE_PROVIDER_SITE_OTHER): Payer: BC Managed Care – PPO

## 2022-03-17 DIAGNOSIS — E538 Deficiency of other specified B group vitamins: Secondary | ICD-10-CM | POA: Diagnosis not present

## 2022-03-17 MED ORDER — CYANOCOBALAMIN 1000 MCG/ML IJ SOLN
1000.0000 ug | Freq: Once | INTRAMUSCULAR | Status: AC
  Administered 2022-03-17: 1000 ug via INTRAMUSCULAR

## 2022-03-17 NOTE — Progress Notes (Signed)
Patient verbally confirmed name, date of birth, and correct medication to be administered. B-12 injection administered and pt tolerated well.  

## 2022-03-31 ENCOUNTER — Ambulatory Visit (INDEPENDENT_AMBULATORY_CARE_PROVIDER_SITE_OTHER): Payer: BC Managed Care – PPO

## 2022-03-31 DIAGNOSIS — E538 Deficiency of other specified B group vitamins: Secondary | ICD-10-CM

## 2022-03-31 MED ORDER — CYANOCOBALAMIN 1000 MCG/ML IJ SOLN
1000.0000 ug | Freq: Once | INTRAMUSCULAR | Status: AC
  Administered 2022-03-31: 1000 ug via INTRAMUSCULAR

## 2022-03-31 NOTE — Progress Notes (Signed)
Vitamin B12 1000 mcg/mL IM injection given today, patient tolerated well.   Patient verified name, DOB and provided verbal consent prior to administration.

## 2022-04-10 ENCOUNTER — Ambulatory Visit (INDEPENDENT_AMBULATORY_CARE_PROVIDER_SITE_OTHER): Payer: BC Managed Care – PPO

## 2022-04-10 DIAGNOSIS — E538 Deficiency of other specified B group vitamins: Secondary | ICD-10-CM | POA: Diagnosis not present

## 2022-04-10 MED ORDER — CYANOCOBALAMIN 1000 MCG/ML IJ SOLN
1000.0000 ug | Freq: Once | INTRAMUSCULAR | Status: AC
  Administered 2022-04-10: 1000 ug via INTRAMUSCULAR

## 2022-04-10 NOTE — Progress Notes (Signed)
Vitamin B12 1000 mch/mL IM injection given today, patient tolerated well.  Patient verified name, DOB and provided verbal consent prior to administration. 

## 2022-04-14 ENCOUNTER — Encounter: Payer: Self-pay | Admitting: Internal Medicine

## 2022-04-17 ENCOUNTER — Ambulatory Visit (INDEPENDENT_AMBULATORY_CARE_PROVIDER_SITE_OTHER): Payer: BC Managed Care – PPO

## 2022-04-17 DIAGNOSIS — E538 Deficiency of other specified B group vitamins: Secondary | ICD-10-CM

## 2022-04-17 MED ORDER — CYANOCOBALAMIN 1000 MCG/ML IJ SOLN
1000.0000 ug | Freq: Once | INTRAMUSCULAR | Status: AC
  Administered 2022-04-17: 1000 ug via INTRAMUSCULAR

## 2022-04-20 ENCOUNTER — Encounter: Payer: Self-pay | Admitting: Internal Medicine

## 2022-04-24 ENCOUNTER — Ambulatory Visit (INDEPENDENT_AMBULATORY_CARE_PROVIDER_SITE_OTHER): Payer: BC Managed Care – PPO

## 2022-04-24 DIAGNOSIS — E538 Deficiency of other specified B group vitamins: Secondary | ICD-10-CM | POA: Diagnosis not present

## 2022-04-24 MED ORDER — CYANOCOBALAMIN 1000 MCG/ML IJ SOLN
1000.0000 ug | Freq: Once | INTRAMUSCULAR | Status: AC
  Administered 2022-04-24: 1000 ug via INTRAMUSCULAR

## 2022-04-24 NOTE — Progress Notes (Signed)
Vitamin B12 1000 mch/mL IM injection given today, patient tolerated well.  Patient verified name, DOB and provided verbal consent prior to administration. 

## 2022-05-05 ENCOUNTER — Ambulatory Visit (INDEPENDENT_AMBULATORY_CARE_PROVIDER_SITE_OTHER): Payer: BC Managed Care – PPO

## 2022-05-05 DIAGNOSIS — E538 Deficiency of other specified B group vitamins: Secondary | ICD-10-CM | POA: Diagnosis not present

## 2022-05-05 MED ORDER — CYANOCOBALAMIN 1000 MCG/ML IJ SOLN
1000.0000 ug | Freq: Once | INTRAMUSCULAR | Status: AC
  Administered 2022-05-05: 1000 ug via INTRAMUSCULAR

## 2022-05-05 NOTE — Progress Notes (Signed)
Patient verbally confirmed name, date of birth, and correct medication to be administered. B-12 injection administered and pt tolerated well.  

## 2022-05-12 ENCOUNTER — Ambulatory Visit: Payer: BC Managed Care – PPO | Admitting: Internal Medicine

## 2022-05-12 ENCOUNTER — Encounter: Payer: Self-pay | Admitting: Internal Medicine

## 2022-05-12 VITALS — BP 150/90 | HR 74 | Ht 64.0 in | Wt 147.8 lb

## 2022-05-12 DIAGNOSIS — E559 Vitamin D deficiency, unspecified: Secondary | ICD-10-CM

## 2022-05-12 DIAGNOSIS — E538 Deficiency of other specified B group vitamins: Secondary | ICD-10-CM

## 2022-05-12 DIAGNOSIS — C73 Malignant neoplasm of thyroid gland: Secondary | ICD-10-CM | POA: Diagnosis not present

## 2022-05-12 DIAGNOSIS — E890 Postprocedural hypothyroidism: Secondary | ICD-10-CM | POA: Diagnosis not present

## 2022-05-12 NOTE — Patient Instructions (Signed)
Please stop at the lab.  Please continue Synthroid 137 mg daily.  Take the thyroid hormone every day, with water, at least 30 minutes before breakfast, separated by at least 4 hours from: - acid reflux medications - calcium - iron - multivitamins  Continue calcium 2x a day. Continue Ergocalciferol.  Please come back for a follow-up appointment in 6 months.

## 2022-05-12 NOTE — Progress Notes (Signed)
Patient ID: Sandra Roberson, female   DOB: 1976/06/12, 46 y.o.   MRN: [id number]  HPI  Sandra Roberson is a 46 y.o.-year-old female, initially referred by Dr.Gerkin, returning for follow-up for papillary thyroid cancer and postsurgical hypothyroidism.  Last visit 4 months ago.  Interim history: Patient started to feel poorly after her RAI treatment with fatigue, generalized muscle aches, tingling, numbness, constant headaches. She has severe neck muscle aches since her RAI treatment and had a cervical spine x-ray.  This showed osteoarthritis - on Celebrex. At last visit, she was still feeling very tired despite starting IM B12. Also, after her RAI treatment, she developed parotiditis>> saw  multiple specialists including rheumatology, ENT, oral surgery  >> was supposed to have surgery (sialoscopy) but she was not able to have this yet due to lack of equipment. She will go to [facility] for this. She continues on ABx.  She feels MUCH better after switching from generic to brand name.   Reviewed history: Pt. describes that in 2019 she started to have recurrent sinus infections blurry vision, problems swallowing and HAs.  She could not have in office visit during the coronavirus pandemic but she finally saw ENT in 02/2021 >> a neck mass was found >> she had a thyroid U/S >> a large thyroid nodule. She had a biopsy of this nodule showing papillary thyroid cancer.  She had total thyroidectomy with Dr. Harlow Asa.  Margins were negative, however, she had 4 out of 4 lymph node metastases.  Afterwards, she continues to feel poorly: Fatigue, restlessness, fluid retention, muscle cramps in her back, tingling, severe constipation with nausea and vomiting and inability to keep food down.  We reduced the dose of her calcium.  We also increased her levothyroxine dose.  She was previously active, exercising in the morning: yoga and running 3 miles daily.  She was not able to do this after the  surgery.  Reviewed previous investigation and treatment:  03/14/2021: Thyroid ultrasound: Parenchymal Echotexture: Mildly heterogeneous  Isthmus: 0.5 cm Right lobe: 4.9 x 2.5 x 3.6 cm  Left lobe: 3.8 x 1.0 x 1.0 cm  _________________________________________________________   Nodule # 1:  Location: Right; mid  Maximum size: 3.7 cm; Other 2 dimensions: 3.4 x 2.5 cm  Composition: solid/almost completely solid (2)  Echogenicity: hypoechoic (2)  Echogenic foci: punctate echogenic foci (3)  ACR TI-RADS total points: 7.  **Given size (>/= 1.0 cm) and appearance, fine needle aspiration of  this highly suspicious nodule should be considered based on TI-RADS  criteria.  ________________________________________________________   IMPRESSION:  Nodule 1 (TI-RADS 5) meets criteria for FNA.   03/20/2021: Thyroid nodule FNA: Papillary thyroid cancer (Bethesda category VI) A formal molecular marker: Positive for thyroid cancer, with the BRAF V600E C1799 T>A mutation  04/05/2021: MRI brain neck obtained due to dysphagia: Right thyroid nodule with leftward displacement of trachea  05/16/2021: Total thyroidectomy with neck dissection-Dr. Gerkin: A. PARATHYROID, LEFT INFERIOR, BIOPSY:  -  Parathyroid tissue   B. THYROID, TOTAL THYROIDECTOMY:  -  Papillary thyroid carcinoma, 4.7 cm  -  Margins uninvolved by carcinoma  -  See oncology table below   C. LYMPH NODES, CENTRAL COMPARTMENT, ZONE 6, EXCISION:   -  Metastatic carcinoma involving four of four lymph nodes (4/4)   ONCOLOGY TABLE:  Procedure: Thyroidectomy  Tumor Focality: Unifocal  Tumor Site: Right lobe  Tumor Size: 4.7 cm  Histologic Type: Papillary carcinoma, classic  Angioinvasion: Not identified  Lymphatic Invasion: Not identified  Extrathyroidal Extension: Not identified  Margin Status: All margins negative for invasive carcinoma  Regional Lymph Node Status:       Number of Lymph Nodes with Tumor: 4       Nodal Level(s)  Involved: Level 6       Size of Largest Metastatic Deposit (cm): 1.1       Extranodal Extension: Cannot be determined       Number of Lymph Nodes Examined: 4       Nodal Level(s) Examined: Level 6  Distant Metastasis:       Distant Site(s) Involved: Not applicable  Pathologic Stage Classification (pTNM, AJCC 8th Edition): pT3a, pN1a   08/01/2021: I131 120.9 mCi  08/11/2021: Posttreatment WBS: No distant metastases or remnant disease in the neck  Lab Results  Component Value Date   THYROGLB 0.2 (L) 01/08/2022   Lab Results  Component Value Date   THGAB <1 01/08/2022   Pt denies: - feeling nodules in neck - hoarseness - dysphagia - choking  Postsurgical hypothyroidism:  She is on Levothyroxine (now brand name) 137 mcg daily (dose decreased 10/2021), taken: - fasting at 5 am - with alarm - goes back to sleep - with water - separated by >30 min from b'fast  - + calcium (Citracal) slow absorption at lunchtime - + Omeprazole 20 mg daily at 5 pm - no iron - + multivitamins midday  After her last TSH results returned in 03/2022, she was tried on the lower dose by my colleague as I was out of office, but she did not tolerate it.  She went back to the previous dose.  I reviewed pt's thyroid tests: 04/16/2022: TSH 0.30 Lab Results  Component Value Date   TSH 0.60 01/08/2022   TSH 0.14 (L) 12/09/2021   TSH 0.05 (L) 10/30/2021   TSH 1.720 09/05/2021   FREET4 0.89 01/08/2022   FREET4 1.08 12/09/2021   FREET4 1.38 10/30/2021   FREET4 1.62 09/05/2021  06/09/2021: TSH 14.9  Postsurgical hypocalcemia:  Reviewed calcium levels: 03/11/2022: Ionized calcium 4.7 (4.5-5.6) 01/08/2022: Ionized calcium 4.7 (4.7-5.5), PTH 27 12/23/2021: Calcium 8.7, magnesium 2.1, both normal 12/09/2021: Ionized calcium 4.7 (4.8-5.6) Lab Results  Component Value Date   PTH 27 01/08/2022   CALCIUM 8.1 (L) 09/05/2021   CALCIUM 8.3 (L) 05/17/2021   CALCIUM 9.9 02/02/2020  06/09/2021: Calcium  9.1 06/03/2021: Calcium 9.2, vitamin D 40.2  She is on 600 mg Citracal with vitamin D BID -increased 08/2021.  Vitamin D insufficiency: Reviewed vitamin D levels: 03/09/2022: vitamin D 37 01/05/2022: Vitamin D 25.3 Lab Results  Component Value Date   VD25OH 38.23 12/09/2021   VD25OH 28.03 (L) 10/30/2021  She takes vitamin D in MVIs and Citracal. Also, PCP added 50,000 units Ergocalciferol once a week.   No FH of thyroid cancer.  No family history of thyroid disease. No h/o radiation tx to head or neck except for RAI treatment.  At last visit, during investigation for fatigue, she was found to have severe B12 deficiency: 12/23/2021: B12 481 Lab Results  Component Value Date   VITAMINB12 93 (L) 12/09/2021   She is currently on weekly B12 injections.  ROS:  + see HPI  Past Medical History:  Diagnosis Date   Allergy 2018   Alllergies   Anemia varying at times   Anxiety varying at times   Cancer [facility]) may 2022   PTC   GERD (gastroesophageal reflux disease)    Postpartum depression    Thyroid disease May 2022- PTC  Thyroidectomy   Past Surgical History:  Procedure Laterality Date   CESAREAN SECTION  2010   LIVER RESECTION  2015   LIVER RESECTION  09/2013   THYROIDECTOMY N/A 05/16/2021   Procedure: TOTAL THYROIDECTOMY WITH LIMITED LYMPH NODE DISSECTION;  Surgeon: Armandina Gemma, MD;  Location: [facility];  Service: General;  Laterality: N/A;   Social History   Socioeconomic History   Marital status: Married    Spouse name: Not on file   Number of children: 1   Years of education: Not on file   Highest education level: Not on file  Occupational History   Occupation: Artist  Tobacco Use   Smoking status: Never   Smokeless tobacco: Never  Vaping Use   Vaping Use: Never used  Substance and Sexual Activity   Alcohol use: Yes    Alcohol/week: 6.0 standard drinks of alcohol    Types: 6 Glasses of wine per week    Comment: Socially    Drug use: No   Sexual activity: Yes    Birth control/protection: Other-see comments    Comment: Husband- Vesictomy  Other Topics Concern   Not on file  Social History Narrative   Not on file   Social Determinants of Health   Financial Resource Strain: Not on file  Food Insecurity: Not on file  Transportation Needs: Not on file  Physical Activity: Not on file  Stress: Not on file  Social Connections: Not on file  Intimate Partner Violence: Not on file   Current Outpatient Medications on File Prior to Visit  Medication Sig Dispense Refill   ALPRAZolam (XANAX) 0.5 MG tablet Take 0.5 mg by mouth daily as needed for anxiety or sleep.     Bromelains 500 MG TABS Take 500 mg by mouth 3 (three) times daily as needed (Drainage).     buPROPion (WELLBUTRIN XL) 150 MG 24 hr tablet Take 150 mg by mouth daily.     Fexofenadine HCl (MUCINEX ALLERGY PO) Take by mouth.     fluticasone (FLONASE) 50 MCG/ACT nasal spray Place 2 sprays into both nostrils daily.     guaiFENesin (MUCINEX) 600 MG 12 hr tablet Take 1,200 mg by mouth daily.     lisdexamfetamine (VYVANSE) 30 MG capsule Take 30 mg by mouth every morning. Vyvanse     Olopatadine HCl 0.2 % SOLN Place 1 drop into both eyes daily.     omeprazole (PRILOSEC) 20 MG capsule TAKE 1 CAPSULE BY MOUTH EVERY DAY (Patient taking differently: Take 20 mg by mouth daily.) 30 capsule 5   Sodium Chloride-Xylitol (XLEAR SINUS CARE SPRAY NA) Place 1 spray into the nose daily as needed (help drain).     SYNTHROID 137 MCG tablet Take 1 tablet (137 mcg total) by mouth daily before breakfast. 45 tablet 5   Vitamin D, Ergocalciferol, (DRISDOL) 1.25 MG (50000 UNIT) CAPS capsule Take 50,000 Units by mouth once a week.     No current facility-administered medications on file prior to visit.   No Known Allergies  Family History  Problem Relation Age of Onset   Hypertension Mother    ADD / ADHD Mother    Varicose Veins Mother    GER disease Father    Alcohol  abuse Father    Breast cancer Maternal Grandmother    Cancer Maternal Grandmother    COPD Maternal Grandmother    Congestive Heart Failure Maternal Grandfather    Cancer Maternal Grandfather    Learning disabilities Maternal Grandfather  Early death Paternal Grandfather    Early death Paternal 59    ADD / ADHD Brother    Learning disabilities Brother    Varicose Veins Brother    Cancer Maternal 9 / Stillbirths Sister    PE: BP (!) 150/90 (BP Location: Right Arm, Patient Position: Sitting, Cuff Size: Normal)   Pulse 74   Ht _0  (1.626 m)   Wt 147 lb 12.8 oz (67 kg)   SpO2 99%   BMI 25.37 kg/m  Wt Readings from Last 3 Encounters:  05/12/22 147 lb 12.8 oz (67 kg)  01/08/22 146 lb (66.2 kg)  09/12/21 144 lb (65.3 kg)   Constitutional: normal weight, in NAD Eyes: PERRLA, EOMI, no exophthalmos ENT: moist mucous membranes, no neck masses palpated, no cervical lymphadenopathy Cardiovascular: RRR, No MRG Respiratory: CTA B Musculoskeletal: no deformities Skin: moist, warm, no rashes Neurological: no tremor with outstretched hands  ASSESSMENT: 1.  Papillary thyroid cancer - see HPI  2. Postsurgical Hypothyroidism  3.  Postsurgical hypocalcemia  4.  Vitamin D insufficiency  5.  Vitamin B12 deficiency  PLAN:  1.  Papillary thyroid cancer - with positive BRAF V600E [phone] T>A mutation -Her thyroid cancer was metastatic, but she is clinically stage I TNM due to age, despite the large tumor and lymph node metastasis. - I reassured her that papillary thyroid cancer is a slow-growing cancer with good prognosis. We discussed about the BRAF mutation as confering her higher risk for recurrence, however, this is very common among patients with papillary thyroid cancer (between 17 and 80% incidence, depending on the study) and new studies show that he can increase mortality and recurrence mostly in patients older than 46 years old. -Since the tumor was  large with lymph node metastasis, RAI treatment was indicated for postop thyroid remnant ablation. -she had RAI treatment on 05/14/2021 and then posttreatment whole-body scan on 08/18/2021.  There were no sites of metastases observed on the scan.  Unfortunately, she developed radiation parotiditis.  She has seen multiple specialists and had many appointments.  She had steroids and antibiotics.  She is planning to have surgery.  She will see a specialist at [facility] for this. -Upon her questioning, we discussed that she would not be a good candidate for another RAI treatment in the future. -At last visit, thyroglobulin level was low, 0.2 and antithyroglobulin antibodies are undetectable -We will recheck these today - I will then see the patient in 6 months  2.  Patient with h/o total thyroidectomy for cancer, now with iatrogenic hypothyroidism, on levothyroxine therapy - latest thyroid labs reviewed with pt. >> slightly low: 04/16/2022: TSH 0.30 - she continues on LT4 137 mcg daily, changed to brand-name per her preference - pt  still feel fatigued, but she feels much better after switching to brand-name Synthroid.  We did discuss in the past about the possibility of adding T3, but we did not have to do this. - we discussed about taking the thyroid hormone every day, with water, >30 minutes before breakfast, separated by >4 hours from acid reflux medications, calcium, iron, multivitamins. Pt. is taking it correctly. - will check thyroid tests today: TSH and fT4 - target is slightly lower 2/2 h/o metatastatic ThyCA.  She was tried on a lower dose of levothyroxine last month but she did not tolerate it well.  We discussed that this was actually an excellent level in the setting of her history of cancer. - If labs are  abnormal, she will need to return for repeat TFTs in 1.5 months  3.  Postsurgical hypocalcemia -Calcium was slightly low right after surgery but then improved.  We reduced her calcium  supplement but the level decreased again (ionized calcium was slightly low, at 4.7 on 12/09/2021) so we increased her calcium supplement to 600 mg twice a day -She continues on this now -At last visit, we checked an ionized calcium and this was normal, at 4.7 (lower limit of normal for the assay was 4.7).  PTH was normal, at 27.  In 02/2022, she had another ionized calcium and this was again 4.7 (lower limit of normal for the today was 4.5). -No perioral numbness or sacral cramping  4.  Vitamin D insufficiency -vitamin D level was 25.3, in 11/2021.  However, this normalized in 02/2022. -At last visit she was taking multivitamins and Citracal and PCP also added ergocalciferol 50,000 units weekly.  She continues on this regimen.  5.  Vitamin B12 deficiency -Severe, diagnosed during investigation for fatigue -She is currently on B12 injections, obtained through our office -Rheumatology recommended to continue with the current schedule.Her B12 deficiency may be autoimmune. -B12 level was 481, normal, on 12/23/2021.  Component     Latest Ref Rng 05/12/2022  TSH     0.35 - 5.50 uIU/mL 3.31   T4,Free(Direct)     0.60 - 1.60 ng/dL 0.78   Thyroglobulin     ng/mL 0.3 (L)   Comment --   Thyroglobulin Ab     < or = 1 IU/mL <1     TSH is higher than our target.  I believe the explanation can be that she was advised to take half of her levothyroxine tablet after the 03/2022 results returned and she was doing this for approximately 2 weeks.  Even though afterwards, she increased the dose, and may not have been enough to bring down her TSH to our target range.  I would suggest to recheck her TSH in approximately 5 weeks and at that time decide for the right dose depending on the result.  At that time, I would also like to recheck her thyroglobulin, since this appears to be very slightly higher than before.  Philemon Kingdom, MD PhD [facility]

## 2022-05-13 LAB — TSH: TSH: 3.31 u[IU]/mL (ref 0.35–5.50)

## 2022-05-13 LAB — THYROGLOBULIN LEVEL: Thyroglobulin: 0.3 ng/mL — ABNORMAL LOW

## 2022-05-13 LAB — THYROGLOBULIN ANTIBODY: Thyroglobulin Ab: <1 IU/mL (ref <=1)

## 2022-05-13 LAB — T4, FREE: Free T4: 0.78 ng/dL (ref 0.60–1.60)

## 2022-05-14 ENCOUNTER — Encounter: Payer: Self-pay | Admitting: Internal Medicine

## 2022-05-14 DIAGNOSIS — E890 Postprocedural hypothyroidism: Secondary | ICD-10-CM

## 2022-05-18 MED ORDER — SYNTHROID 137 MCG PO TABS: 137.0000 ug | ORAL_TABLET | Freq: Every day | ORAL | 2 refills | Status: DC

## 2022-05-20 ENCOUNTER — Ambulatory Visit (INDEPENDENT_AMBULATORY_CARE_PROVIDER_SITE_OTHER): Payer: BC Managed Care – PPO

## 2022-05-20 DIAGNOSIS — E538 Deficiency of other specified B group vitamins: Secondary | ICD-10-CM | POA: Diagnosis not present

## 2022-05-20 MED ORDER — CYANOCOBALAMIN 1000 MCG/ML IJ SOLN
1000.0000 ug | Freq: Once | INTRAMUSCULAR | Status: AC
  Administered 2022-05-20: 1000 ug via INTRAMUSCULAR

## 2022-05-20 NOTE — Progress Notes (Signed)
Vitamin B12 1000 mch/mL IM injection given today, patient tolerated well.  Patient verified name, DOB and provided verbal consent prior to administration. 

## 2022-05-26 ENCOUNTER — Ambulatory Visit: Payer: BC Managed Care – PPO

## 2022-05-29 ENCOUNTER — Ambulatory Visit (INDEPENDENT_AMBULATORY_CARE_PROVIDER_SITE_OTHER): Payer: BC Managed Care – PPO

## 2022-05-29 DIAGNOSIS — E538 Deficiency of other specified B group vitamins: Secondary | ICD-10-CM | POA: Diagnosis not present

## 2022-05-29 MED ORDER — CYANOCOBALAMIN 1000 MCG/ML IJ SOLN
1000.0000 ug | Freq: Once | INTRAMUSCULAR | Status: AC
  Administered 2022-05-29: 1000 ug via INTRAMUSCULAR

## 2022-05-29 NOTE — Progress Notes (Cosign Needed Addendum)
Vitamin B12 1000 mch/mL IM injection given today, patient tolerated well.  Patient verified name, DOB and provided verbal consent prior to administration. 

## 2022-06-02 ENCOUNTER — Ambulatory Visit: Payer: BC Managed Care – PPO

## 2022-06-05 ENCOUNTER — Ambulatory Visit (INDEPENDENT_AMBULATORY_CARE_PROVIDER_SITE_OTHER): Payer: BC Managed Care – PPO

## 2022-06-05 DIAGNOSIS — E538 Deficiency of other specified B group vitamins: Secondary | ICD-10-CM

## 2022-06-05 MED ORDER — CYANOCOBALAMIN 1000 MCG/ML IJ SOLN
1000.0000 ug | Freq: Once | INTRAMUSCULAR | Status: AC
  Administered 2022-06-05: 1000 ug via INTRAMUSCULAR

## 2022-06-05 NOTE — Progress Notes (Signed)
Patient verbally confirmed name, date of birth, and correct medication to be administered. B12 Injection administered and pt tolerated well.  

## 2022-06-12 ENCOUNTER — Ambulatory Visit (INDEPENDENT_AMBULATORY_CARE_PROVIDER_SITE_OTHER): Payer: BC Managed Care – PPO

## 2022-06-12 ENCOUNTER — Other Ambulatory Visit (INDEPENDENT_AMBULATORY_CARE_PROVIDER_SITE_OTHER): Payer: BC Managed Care – PPO

## 2022-06-12 DIAGNOSIS — E890 Postprocedural hypothyroidism: Secondary | ICD-10-CM | POA: Diagnosis not present

## 2022-06-12 DIAGNOSIS — C73 Malignant neoplasm of thyroid gland: Secondary | ICD-10-CM

## 2022-06-12 DIAGNOSIS — E538 Deficiency of other specified B group vitamins: Secondary | ICD-10-CM | POA: Diagnosis not present

## 2022-06-12 LAB — T4, FREE: Free T4: 1.04 ng/dL (ref 0.60–1.60)

## 2022-06-12 LAB — TSH: TSH: 0.41 u[IU]/mL (ref 0.35–5.50)

## 2022-06-12 MED ORDER — CYANOCOBALAMIN 1000 MCG/ML IJ SOLN
1000.0000 ug | Freq: Once | INTRAMUSCULAR | Status: AC
  Administered 2022-06-12: 1000 ug via INTRAMUSCULAR

## 2022-06-12 NOTE — Progress Notes (Signed)
Vitamin B12 1000 mch/mL IM injection given today, patient tolerated well.  Patient verified name, DOB and provided verbal consent prior to administration. 

## 2022-06-15 ENCOUNTER — Other Ambulatory Visit: Payer: Self-pay | Admitting: Internal Medicine

## 2022-06-15 DIAGNOSIS — E890 Postprocedural hypothyroidism: Secondary | ICD-10-CM

## 2022-06-15 LAB — THYROGLOBULIN LEVEL: Thyroglobulin: 0.3 ng/mL — ABNORMAL LOW

## 2022-06-15 LAB — THYROGLOBULIN ANTIBODY: Thyroglobulin Ab: <1 IU/mL (ref <=1)

## 2022-06-15 MED ORDER — SYNTHROID 150 MCG PO TABS: 150.0000 ug | ORAL_TABLET | Freq: Every day | ORAL | 3 refills | Status: DC

## 2022-06-19 ENCOUNTER — Emergency Department (HOSPITAL_COMMUNITY): Payer: BC Managed Care – PPO

## 2022-06-19 ENCOUNTER — Ambulatory Visit: Payer: BC Managed Care – PPO

## 2022-06-19 ENCOUNTER — Emergency Department (HOSPITAL_COMMUNITY)
Admission: EM | Admit: 2022-06-19 | Discharge: 2022-06-19 | Disposition: A | Discharge status: Home / Self Care | Payer: BC Managed Care – PPO | Attending: Emergency Medicine | Admitting: Emergency Medicine

## 2022-06-19 ENCOUNTER — Encounter (HOSPITAL_COMMUNITY): Payer: Self-pay

## 2022-06-19 DIAGNOSIS — Z85850 Personal history of malignant neoplasm of thyroid: Secondary | ICD-10-CM | POA: Diagnosis not present

## 2022-06-19 DIAGNOSIS — R519 Headache, unspecified: Secondary | ICD-10-CM | POA: Diagnosis present

## 2022-06-19 DIAGNOSIS — H538 Other visual disturbances: Secondary | ICD-10-CM | POA: Diagnosis not present

## 2022-06-19 LAB — BASIC METABOLIC PANEL
Anion gap: 8 (ref 5–15)
BUN: 13 mg/dL (ref 6–20)
CO2: 26 mmol/L (ref 22–32)
Calcium: 8.5 mg/dL — ABNORMAL LOW (ref 8.9–10.3)
Chloride: 103 mmol/L (ref 98–111)
Creatinine, Ser: 0.72 mg/dL (ref 0.44–1.00)
GFR, Estimated: >60 mL/min (ref >60)
Glucose, Bld: 98 mg/dL (ref 70–99)
Potassium: 3.8 mmol/L (ref 3.5–5.1)
Sodium: 137 mmol/L (ref 135–145)

## 2022-06-19 LAB — CBC WITH DIFFERENTIAL/PLATELET
Abs Immature Granulocytes: 0.03 10*3/uL (ref 0.00–0.07)
Basophils Absolute: 0 10*3/uL (ref 0.0–0.1)
Basophils Relative: 0 %
Eosinophils Absolute: 0.1 10*3/uL (ref 0.0–0.5)
Eosinophils Relative: 1 %
HCT: 32.8 % — ABNORMAL LOW (ref 36.0–46.0)
Hemoglobin: 10.8 g/dL — ABNORMAL LOW (ref 12.0–15.0)
Immature Granulocytes: 0 %
Lymphocytes Relative: 32 %
Lymphs Abs: 2.4 10*3/uL (ref 0.7–4.0)
MCH: 32.7 pg (ref 26.0–34.0)
MCHC: 32.9 g/dL (ref 30.0–36.0)
MCV: 99.4 fL (ref 80.0–100.0)
Monocytes Absolute: 0.7 10*3/uL (ref 0.1–1.0)
Monocytes Relative: 9 %
Neutro Abs: 4.4 10*3/uL (ref 1.7–7.7)
Neutrophils Relative %: 58 %
Platelets: 352 10*3/uL (ref 150–400)
RBC: 3.3 MIL/uL — ABNORMAL LOW (ref 3.87–5.11)
RDW: 13.2 % (ref 11.5–15.5)
WBC: 7.7 10*3/uL (ref 4.0–10.5)
nRBC: 0 % (ref 0.0–0.2)

## 2022-06-19 LAB — PREGNANCY, URINE: Preg Test, Ur: NEGATIVE

## 2022-06-19 MED ORDER — SODIUM CHLORIDE 0.9 % IV SOLN
500.0000 mg | Freq: Once | INTRAVENOUS | Status: DC
  Filled 2022-06-19: qty 2

## 2022-06-19 MED ORDER — BUTALBITAL-APAP-CAFFEINE 50-325-40 MG PO TABS
1.0000 | ORAL_TABLET | Freq: Once | ORAL | Status: AC
  Administered 2022-06-19: 1 via ORAL
  Filled 2022-06-19: qty 1

## 2022-06-19 MED ORDER — BUTALBITAL-APAP-CAFFEINE 50-325-40 MG PO TABS: 1.0000 | ORAL_TABLET | Freq: Four times a day (QID) | ORAL | 0 refills | Status: AC | PRN

## 2022-06-19 MED ORDER — CAFFEINE 200 MG PO TABS: 300.0000 mg | ORAL_TABLET | Freq: Once | ORAL | Status: DC

## 2022-06-19 MED ORDER — DIPHENHYDRAMINE HCL 50 MG/ML IJ SOLN
25.0000 mg | Freq: Once | INTRAMUSCULAR | Status: AC
  Administered 2022-06-19: 25 mg via INTRAVENOUS
  Filled 2022-06-19: qty 1

## 2022-06-19 MED ORDER — PROCHLORPERAZINE EDISYLATE 10 MG/2ML IJ SOLN
10.0000 mg | Freq: Once | INTRAMUSCULAR | Status: AC
  Administered 2022-06-19: 10 mg via INTRAVENOUS
  Filled 2022-06-19: qty 2

## 2022-06-19 MED ORDER — LACTATED RINGERS IV BOLUS
1000.0000 mL | Freq: Once | INTRAVENOUS | Status: AC
  Administered 2022-06-19: 1000 mL via INTRAVENOUS

## 2022-06-19 NOTE — ED Triage Notes (Addendum)
Pt arrived via POV, c/o headache and intermittent numbness and tingling since spinal on Tuesday. States headache is lessened while laying flat.

## 2022-06-19 NOTE — ED Provider Notes (Signed)
[facility] DEPT Provider Note   CSN: [id number] Arrival date & time: 06/19/22  1234     History Chief Complaint  Patient presents with   Headache    HPI Sandra Roberson is a 46 y.o. female presenting for headache.  She has a complex medical history including recent thyroid cancer, chronic neck pain recently undergoing procedural intervention.  She states that since yesterday she has had intermittent headache.  It is only present when she is standing up.  It does not bother her when lying flat.  She is currently able to stand up and sit with only mild symptoms.  She states that her outpatient doctor told her to present for placement of a blood patch for concern for spinal leak.  She denies fevers or chills nausea or vomiting, syncope shortness of breath.  She otherwise ambulatory tolerating p.o. intake.  No medications prior to arrival.  She has intermittent blurry vision.  Not present at this time.   Patient's recorded medical, surgical, social, medication list and allergies were reviewed in the Snapshot window as part of the initial history.   Review of Systems   Review of Systems  Constitutional:  Negative for chills and fever.  HENT:  Negative for ear pain and sore throat.   Eyes:  Negative for pain and visual disturbance.  Respiratory:  Negative for cough and shortness of breath.   Cardiovascular:  Negative for chest pain and palpitations.  Gastrointestinal:  Negative for abdominal pain and vomiting.  Genitourinary:  Negative for dysuria and hematuria.  Musculoskeletal:  Negative for arthralgias and back pain.  Skin:  Negative for color change and rash.  Neurological:  Positive for headaches. Negative for seizures and syncope.  All other systems reviewed and are negative.   Physical Exam Updated Vital Signs BP (!) 150/90 (BP Location: Left Arm)   Pulse 70   Temp 97.8 F (36.6 C) (Oral)   Resp 20   SpO2 100%  Physical Exam Vitals and  nursing note reviewed.  Constitutional:      General: She is not in acute distress.    Appearance: She is well-developed.  HENT:     Head: Normocephalic and atraumatic.  Eyes:     Conjunctiva/sclera: Conjunctivae normal.  Cardiovascular:     Rate and Rhythm: Normal rate and regular rhythm.     Heart sounds: No murmur heard. Pulmonary:     Effort: Pulmonary effort is normal. No respiratory distress.     Breath sounds: Normal breath sounds.  Abdominal:     General: There is no distension.     Palpations: Abdomen is soft.     Tenderness: There is no abdominal tenderness. There is no right CVA tenderness or left CVA tenderness.  Musculoskeletal:        General: No swelling or tenderness. Normal range of motion.     Cervical back: Neck supple.  Skin:    General: Skin is warm and dry.  Neurological:     General: No focal deficit present.     Mental Status: She is alert and oriented to person, place, and time. Mental status is at baseline.     Cranial Nerves: No cranial nerve deficit.      ED Course/ Medical Decision Making/ A&P    Procedures Procedures   Medications Ordered in ED Medications - No data to display Medical Decision Making:   Sandra Roberson is a 46 y.o. female who presented to the ED today with worsening headache  detailed above.    Patient's presentation is complicated by their history of multiple comorbid medical problems.  Patient placed on continuous vitals and telemetry monitoring while in ED which was reviewed periodically.   Complete initial physical exam performed, notably the patient  was HDS in NAD.    Reviewed and confirmed nursing documentation for past medical history, family history, social history.    Initial Assessment:   With the patient's presentation of headache, most likely diagnosis is tension type headaches vs atypical migraines. Other diagnoses were considered including (but not limited to) intracranial mass, intracranial hemorrhage,  intracranial infection including meningitis vs encephalitis, GCA, trigeminal neuralgia. These are considered less likely due to history of present illness and physical exam findings.    This is most consistent with an acute complicated illness   Timeline and slow onset is not consistent with SAH/ICH   Age and description of pain is not consistent with GCA   Lack of fever,meningismus is not consistent with meningitis/encephalitis   Initial Plan:  Will initiate treatment with Compazine/Benardryll/NSAIDS/Tylenol for treatment of nonspecific headache  Screening labs including CBC and Metabolic panel to evaluate for infectious or metabolic etiology of disease.  Urinalysis with reflex culture ordered to evaluate for UTI or relevant urologic/nephrologic pathology.  CTH to evaluate for structural IC etiology  Attempted to coordinate blood patch in the emergency department, unfortunately unable to due to time of day and available resources.  Additionally, attempted to treat patient with IV caffeine, unfortunately pharmacy is unable to provide this medication p.o. or IV.  Was able to provide patient with Fioricet p.o. which had substantial headache relief patient felt comfortable with discharge.  Objective evaluation as below reviewed   Initial Study Results:   Laboratory  All laboratory results reviewed without evidence of clinically relevant pathology.     EKG EKG was reviewed independently. Rate, rhythm, axis, intervals all examined and without medically relevant abnormality. ST segments without concerns for elevations.    Radiology:  All images reviewed independently. Agree with radiology report at this time.   CT Head Wo Contrast  Final Result        Final Assessment and Plan:   On reassessment, patient has had improvement of her symptoms.  Blurry vision remains intermittent and she is ambulatory tolerating p.o. intake.  CT scan does have findings of partially empty sella which would warrant  outpatient neurologic follow-up.  Discussed this with the patient.  She already has follow-up with a neurologist scheduled.  It is reasonable to keep her outpatient care and management plan at this time.  We will additionally prescribe patient Fioricet since she had symptomatic improvement.  Prescribed short course and recommend close outpatient follow-up with PCP and neurology.  Strict return precautions regarding development of worsening blurry vision symptoms, worsening headache, light intolerance or severe nausea vomiting and patient expressed understanding.   Clinical Impression:  1. Bad headache      Discharge   Clinical Impression: No diagnosis found.   Data Unavailable   Final Clinical Impression(s) / ED Diagnoses Final diagnoses:  None    Rx / DC Orders ED Discharge Orders     None         Tretha Sciara, MD 06/19/22 1730

## 2022-06-23 ENCOUNTER — Ambulatory Visit (INDEPENDENT_AMBULATORY_CARE_PROVIDER_SITE_OTHER): Payer: BC Managed Care – PPO

## 2022-06-23 DIAGNOSIS — E538 Deficiency of other specified B group vitamins: Secondary | ICD-10-CM | POA: Diagnosis not present

## 2022-06-23 MED ORDER — CYANOCOBALAMIN 1000 MCG/ML IJ SOLN
1000.0000 ug | Freq: Once | INTRAMUSCULAR | Status: AC
  Administered 2022-06-23: 1000 ug via INTRAMUSCULAR

## 2022-06-23 NOTE — Progress Notes (Signed)
Patient verbally confirmed name, date of birth, and correct medication to be administered. B-12 injection administered and pt tolerated well.  

## 2022-06-26 ENCOUNTER — Telehealth: Payer: Self-pay

## 2022-06-26 ENCOUNTER — Ambulatory Visit (INDEPENDENT_AMBULATORY_CARE_PROVIDER_SITE_OTHER): Payer: BC Managed Care – PPO

## 2022-06-26 ENCOUNTER — Other Ambulatory Visit: Payer: Self-pay | Admitting: Internal Medicine

## 2022-06-26 DIAGNOSIS — E538 Deficiency of other specified B group vitamins: Secondary | ICD-10-CM

## 2022-06-26 MED ORDER — CYANOCOBALAMIN 1000 MCG/ML IJ SOLN
1000.0000 ug | Freq: Once | INTRAMUSCULAR | Status: AC
  Administered 2022-06-26: 1000 ug via INTRAMUSCULAR

## 2022-06-26 MED ORDER — CYANOCOBALAMIN 1000 MCG/ML IJ SOLN: 1000.0000 ug | Freq: Once | INTRAMUSCULAR | 0 refills | Status: AC

## 2022-06-26 MED ORDER — "BD ECLIPSE SYRINGE/NEEDLE 25G X 5/8"" 3 ML MISC": 1 refills | Status: DC

## 2022-06-26 NOTE — Telephone Encounter (Signed)
Pt requested rx for B-12 be sent to pharmacy for self administration.

## 2022-06-26 NOTE — Progress Notes (Signed)
Patient verbally confirmed name, date of birth, and correct medication to be administered. B-12 injection administered and pt tolerated well.  

## 2022-06-26 NOTE — Telephone Encounter (Signed)
Sent!

## 2022-07-03 ENCOUNTER — Ambulatory Visit (INDEPENDENT_AMBULATORY_CARE_PROVIDER_SITE_OTHER): Payer: BC Managed Care – PPO

## 2022-07-03 DIAGNOSIS — E538 Deficiency of other specified B group vitamins: Secondary | ICD-10-CM | POA: Diagnosis not present

## 2022-07-03 MED ORDER — CYANOCOBALAMIN 1000 MCG/ML IJ SOLN
1000.0000 ug | Freq: Once | INTRAMUSCULAR | Status: AC
  Administered 2022-07-03: 1000 ug via INTRAMUSCULAR

## 2022-07-03 NOTE — Progress Notes (Signed)
Vitamin B12 1000 mch/mL IM injection given today, patient tolerated well.  Patient verified name, DOB and provided verbal consent prior to administration. 

## 2022-07-10 ENCOUNTER — Ambulatory Visit (INDEPENDENT_AMBULATORY_CARE_PROVIDER_SITE_OTHER): Payer: BC Managed Care – PPO

## 2022-07-10 DIAGNOSIS — E538 Deficiency of other specified B group vitamins: Secondary | ICD-10-CM | POA: Diagnosis not present

## 2022-07-10 MED ORDER — CYANOCOBALAMIN 1000 MCG/ML IJ SOLN
1000.0000 ug | Freq: Once | INTRAMUSCULAR | Status: AC
  Administered 2022-07-10: 1000 ug via INTRAMUSCULAR

## 2022-07-10 NOTE — Progress Notes (Signed)
Vitamin B12 1000 mch/mL IM injection given today, patient tolerated well.  Patient verified name, DOB and provided verbal consent prior to administration. 

## 2022-07-17 ENCOUNTER — Ambulatory Visit (INDEPENDENT_AMBULATORY_CARE_PROVIDER_SITE_OTHER): Payer: BC Managed Care – PPO

## 2022-07-17 DIAGNOSIS — E538 Deficiency of other specified B group vitamins: Secondary | ICD-10-CM | POA: Diagnosis not present

## 2022-07-17 MED ORDER — CYANOCOBALAMIN 1000 MCG/ML IJ SOLN
1000.0000 ug | Freq: Once | INTRAMUSCULAR | Status: AC
  Administered 2022-07-17: 1000 ug via INTRAMUSCULAR

## 2022-07-17 NOTE — Progress Notes (Signed)
Vitamin B12 1000 mch/mL IM injection given today, patient tolerated well. Patient verified name, DOB and provided verbal consent prior to administration.  Patient verified name, DOB and provided verbal consent prior to administration.

## 2022-07-22 ENCOUNTER — Ambulatory Visit (INDEPENDENT_AMBULATORY_CARE_PROVIDER_SITE_OTHER): Payer: BC Managed Care – PPO

## 2022-07-22 ENCOUNTER — Ambulatory Visit: Payer: BC Managed Care – PPO

## 2022-07-22 DIAGNOSIS — E538 Deficiency of other specified B group vitamins: Secondary | ICD-10-CM | POA: Diagnosis not present

## 2022-07-22 MED ORDER — CYANOCOBALAMIN 1000 MCG/ML IJ SOLN
1000.0000 ug | Freq: Once | INTRAMUSCULAR | Status: AC
  Administered 2022-07-22: 1000 ug via INTRAMUSCULAR

## 2022-07-22 NOTE — Progress Notes (Signed)
Patient verbally confirmed name, date of birth, and correct medication to be administered. B-12 injection administered and pt tolerated well.  

## 2022-07-31 ENCOUNTER — Ambulatory Visit (INDEPENDENT_AMBULATORY_CARE_PROVIDER_SITE_OTHER): Payer: BC Managed Care – PPO

## 2022-07-31 DIAGNOSIS — E538 Deficiency of other specified B group vitamins: Secondary | ICD-10-CM | POA: Diagnosis not present

## 2022-07-31 MED ORDER — CYANOCOBALAMIN 1000 MCG/ML IJ SOLN
1000.0000 ug | Freq: Once | INTRAMUSCULAR | Status: AC
  Administered 2022-07-31: 1000 ug via INTRAMUSCULAR

## 2022-07-31 NOTE — Progress Notes (Signed)
Vitamin B12 1000 mch/mL IM injection given today, patient tolerated well.  Patient verified name, DOB and provided verbal consent prior to administration. 

## 2022-08-07 ENCOUNTER — Ambulatory Visit (INDEPENDENT_AMBULATORY_CARE_PROVIDER_SITE_OTHER): Payer: BC Managed Care – PPO

## 2022-08-07 DIAGNOSIS — E538 Deficiency of other specified B group vitamins: Secondary | ICD-10-CM | POA: Diagnosis not present

## 2022-08-07 MED ORDER — CYANOCOBALAMIN 1000 MCG/ML IJ SOLN
1000.0000 ug | Freq: Once | INTRAMUSCULAR | Status: AC
  Administered 2022-08-07: 1000 ug via INTRAMUSCULAR

## 2022-08-07 NOTE — Progress Notes (Signed)
Vitamin B12 1000 mch/mL IM injection given today, patient tolerated well.  Patient verified name, DOB and provided verbal consent prior to administration. 

## 2022-08-14 ENCOUNTER — Ambulatory Visit (INDEPENDENT_AMBULATORY_CARE_PROVIDER_SITE_OTHER): Payer: BC Managed Care – PPO

## 2022-08-14 DIAGNOSIS — E538 Deficiency of other specified B group vitamins: Secondary | ICD-10-CM

## 2022-08-14 MED ORDER — CYANOCOBALAMIN 1000 MCG/ML IJ SOLN
1000.0000 ug | Freq: Once | INTRAMUSCULAR | Status: AC
  Administered 2022-08-14: 1000 ug via INTRAMUSCULAR

## 2022-08-14 NOTE — Progress Notes (Signed)
Vitamin B12 1000 mch/mL IM injection given today, patient tolerated well.  Patient verified name, DOB and provided verbal consent prior to administration. 

## 2022-08-21 ENCOUNTER — Ambulatory Visit (INDEPENDENT_AMBULATORY_CARE_PROVIDER_SITE_OTHER): Payer: BC Managed Care – PPO

## 2022-08-21 DIAGNOSIS — E538 Deficiency of other specified B group vitamins: Secondary | ICD-10-CM | POA: Diagnosis not present

## 2022-08-21 MED ORDER — CYANOCOBALAMIN 1000 MCG/ML IJ SOLN
1000.0000 ug | Freq: Once | INTRAMUSCULAR | Status: AC
  Administered 2022-08-21: 1000 ug via INTRAMUSCULAR

## 2022-08-21 NOTE — Progress Notes (Signed)
Vitamin B12 1000 mch/mL IM injection given today, patient tolerated well.  Patient verified name, DOB and provided verbal consent prior to administration. 

## 2022-09-01 ENCOUNTER — Ambulatory Visit: Payer: BC Managed Care – PPO

## 2022-09-04 ENCOUNTER — Ambulatory Visit (INDEPENDENT_AMBULATORY_CARE_PROVIDER_SITE_OTHER): Payer: BC Managed Care – PPO

## 2022-09-04 VITALS — BP 130/84 | HR 68

## 2022-09-04 DIAGNOSIS — E538 Deficiency of other specified B group vitamins: Secondary | ICD-10-CM

## 2022-09-04 MED ORDER — CYANOCOBALAMIN 1000 MCG/ML IJ SOLN
1000.0000 ug | Freq: Once | INTRAMUSCULAR | Status: AC
  Administered 2022-09-04: 1000 ug via INTRAMUSCULAR

## 2022-09-04 NOTE — Progress Notes (Signed)
Patient verbally confirmed name, date of birth, and correct medication to be administered. B12 Injection administered and pt tolerated well.

## 2022-09-11 ENCOUNTER — Ambulatory Visit (INDEPENDENT_AMBULATORY_CARE_PROVIDER_SITE_OTHER): Payer: BC Managed Care – PPO

## 2022-09-11 VITALS — BP 130/82

## 2022-09-11 DIAGNOSIS — E538 Deficiency of other specified B group vitamins: Secondary | ICD-10-CM | POA: Diagnosis not present

## 2022-09-11 MED ORDER — CYANOCOBALAMIN 1000 MCG/ML IJ SOLN
1000.0000 ug | INTRAMUSCULAR | Status: DC
  Administered 2022-09-11: 1000 ug via INTRAMUSCULAR

## 2022-09-11 NOTE — Progress Notes (Signed)
After obtaining consent, and per orders of Dr. Cruzita Lederer, injection of B-12 17m given by Mairead Schwarzkopf L Lalo Tromp in left Ventrogluteal . Patient instructed to remain in clinic for 10 minutes afterwards, and to report any adverse reaction to me immediately.

## 2022-09-22 ENCOUNTER — Ambulatory Visit (INDEPENDENT_AMBULATORY_CARE_PROVIDER_SITE_OTHER): Payer: BC Managed Care – PPO

## 2022-09-22 VITALS — BP 142/82 | HR 74

## 2022-09-22 DIAGNOSIS — E538 Deficiency of other specified B group vitamins: Secondary | ICD-10-CM

## 2022-09-22 MED ORDER — CYANOCOBALAMIN 1000 MCG/ML IJ SOLN
1000.0000 ug | Freq: Once | INTRAMUSCULAR | Status: AC
  Administered 2022-09-22: 1000 ug via INTRAMUSCULAR

## 2022-09-22 NOTE — Progress Notes (Signed)
Patient verbally confirmed name, date of birth, and correct medication to be administered. B12 Injection administered and pt tolerated well.

## 2022-09-28 ENCOUNTER — Encounter: Payer: Self-pay | Admitting: Internal Medicine

## 2022-09-28 ENCOUNTER — Ambulatory Visit: Payer: BC Managed Care – PPO | Admitting: Internal Medicine

## 2022-09-28 VITALS — BP 120/82 | HR 76 | Ht 64.0 in | Wt 147.2 lb

## 2022-09-28 DIAGNOSIS — E890 Postprocedural hypothyroidism: Secondary | ICD-10-CM | POA: Diagnosis not present

## 2022-09-28 DIAGNOSIS — C73 Malignant neoplasm of thyroid gland: Secondary | ICD-10-CM

## 2022-09-28 DIAGNOSIS — E538 Deficiency of other specified B group vitamins: Secondary | ICD-10-CM | POA: Diagnosis not present

## 2022-09-28 MED ORDER — CYANOCOBALAMIN 1000 MCG/ML IJ SOLN
1000.0000 ug | Freq: Once | INTRAMUSCULAR | Status: AC
  Administered 2022-09-28: 1000 ug via INTRAMUSCULAR

## 2022-09-28 NOTE — Patient Instructions (Signed)
Please stop at the lab.  Please continue Synthroid 150 mg daily.  Take the thyroid hormone every day, with water, at least 30 minutes before breakfast, separated by at least 4 hours from: - acid reflux medications - calcium - iron - multivitamins  Please come back for a follow-up appointment in 6 months.

## 2022-09-28 NOTE — Progress Notes (Unsigned)
Patient ID: Sandra Roberson, female   DOB: 05/25/1976, 46 y.o.   MRN: [id number]  HPI  Sandra Roberson is a 46 y.o.-year-old female, initially referred by Dr.Gerkin, returning for follow-up for papillary thyroid cancer and postsurgical hypothyroidism.  Last visit 4.5 months ago.  Interim history: Patient started to feel poorly after her RAI treatment: - fatigue, generalized muscle aches, tingling, numbness, constant headaches -starting to feel better after switching from generic to brand name levothyroxine - severe neck muscle aches since her RAI treatment and had a cervical spine x-ray - osteoarthritis - on Celebrex. - parotiditis >> saw  multiple specialists including rheumatology, ENT, oral surgery - on ABx.  Since last visit she saw ENT at [place].  She had salivary microsurgery -goes to the clinic there once a month for this. They suggested to see an endocrinologist in the system.  She ended up seeing Dr. Evron in 06/2022.  However, afterwards she  returned to see me, but may need to return to see Dr. Evron in the future, depending on the evolution of her PTC   Reviewed history: Pt. describes that in 2019 she started to have recurrent sinus infections blurry vision, problems swallowing and HAs.  She could not have in office visit during the coronavirus pandemic but she finally saw ENT in 02/2021 >> a neck mass was found >> she had a thyroid U/S >> a large thyroid nodule. She had a biopsy of this nodule showing papillary thyroid cancer.  She had total thyroidectomy with Dr. Gerkin.  Margins were negative, however, she had 4 out of 4 lymph node metastases.  Afterwards, she continues to feel poorly: Fatigue, restlessness, fluid retention, muscle cramps in her back, tingling, severe constipation with nausea and vomiting and inability to keep food down.  We reduced the dose of her calcium.  We also increased her levothyroxine dose.  She was previously active, exercising in the morning:  yoga and running 3 miles daily.  She was not able to do this after the surgery.  Reviewed and addended previous investigation and treatment: 03/14/2021: Thyroid ultrasound: Parenchymal Echotexture: Mildly heterogeneous  Isthmus: 0.5 cm Right lobe: 4.9 x 2.5 x 3.6 cm  Left lobe: 3.8 x 1.0 x 1.0 cm  _________________________________________________________   Nodule # 1:  Location: Right; mid  Maximum size: 3.7 cm; Other 2 dimensions: 3.4 x 2.5 cm  Composition: solid/almost completely solid (2)  Echogenicity: hypoechoic (2)  Echogenic foci: punctate echogenic foci (3)  ACR TI-RADS total points: 7.  **Given size (>/= 1.0 cm) and appearance, fine needle aspiration of  this highly suspicious nodule should be considered based on TI-RADS  criteria.  ________________________________________________________   IMPRESSION:  Nodule 1 (TI-RADS 5) meets criteria for FNA.   03/20/2021: Thyroid nodule FNA: Papillary thyroid cancer (Bethesda category VI) A formal molecular marker: Positive for thyroid cancer, with the BRAF [id number] [id number] T>A mutation  04/05/2021: MRI brain neck obtained due to dysphagia: Right thyroid nodule with leftward displacement of trachea  05/16/2021: Total thyroidectomy with neck dissection-Dr. Gerkin: A. PARATHYROID, LEFT INFERIOR, BIOPSY:  -  Parathyroid tissue   B. THYROID, TOTAL THYROIDECTOMY:  -  Papillary thyroid carcinoma, 4.7 cm  -  Margins uninvolved by carcinoma  -  See oncology table below   C. LYMPH NODES, CENTRAL COMPARTMENT, ZONE 6, EXCISION:   -  Metastatic carcinoma involving four of four lymph nodes (4/4)   ONCOLOGY TABLE:  Procedure: Thyroidectomy  Tumor Focality: Unifocal  Tumor Site: Right lobe  Tumor   Size: 4.7 cm  Histologic Type: Papillary carcinoma, classic  Angioinvasion: Not identified  Lymphatic Invasion: Not identified  Extrathyroidal Extension: Not identified  Margin Status: All margins negative for invasive carcinoma  Regional Lymph  Node Status:       Number of Lymph Nodes with Tumor: 4       Nodal Level(s) Involved: Level 6       Size of Largest Metastatic Deposit (cm): 1.1       Extranodal Extension: Cannot be determined       Number of Lymph Nodes Examined: 4       Nodal Level(s) Examined: Level 6  Distant Metastasis:       Distant Site(s) Involved: Not applicable  Pathologic Stage Classification (pTNM, AJCC 8th Edition): pT3a, pN1a   08/01/2021: I131 120.9 mCi  08/11/2021: Posttreatment WBS: No distant metastases or remnant disease in the neck   Lab Results  Component Value Date   THYROGLB 0.3 (L) 06/12/2022   THYROGLB 0.3 (L) 05/12/2022   THYROGLB 0.2 (L) 01/08/2022   Lab Results  Component Value Date   THGAB <1 06/12/2022   THGAB <1 05/12/2022   THGAB <1 01/08/2022   Pt denies: - feeling nodules in neck - hoarseness - dysphagia - choking  Postsurgical hypothyroidism:  She is on Levothyroxine (now brand name) 150 mcg daily (increased at last visit): - fasting at 5 am - with alarm - goes back to sleep - with water - separated by >30 min from b'fast  - + calcium (Citracal) slow absorption at lunchtime - + Omeprazole 20 mg daily at 5 pm - no iron - + multivitamins midday - not in last week  After her last TSH results returned in 03/2022, she was tried on the lower dose by my colleague as I was out of office, but she did not tolerate it.  She went back to the previous dose.  I reviewed pt's thyroid tests: Lab Results  Component Value Date   TSH 0.41 06/12/2022   TSH 3.31 05/12/2022   TSH 0.60 01/08/2022   TSH 0.14 (L) 12/09/2021   TSH 0.05 (L) 10/30/2021   TSH 1.720 09/05/2021   FREET4 1.04 06/12/2022   FREET4 0.78 05/12/2022   FREET4 0.89 01/08/2022   FREET4 1.08 12/09/2021   FREET4 1.38 10/30/2021   FREET4 1.62 09/05/2021  04/16/2022: TSH 0.30 06/09/2021: TSH 14.9  Postsurgical hypocalcemia:  Reviewed calcium levels: 07/10/2022: Calcium 8.8 (8.7-10.4), phosphorus 2.7 (2.4-5.1),  albumin 3.9. 03/11/2022: Ionized calcium 4.7 (4.5-5.6) 01/08/2022: Ionized calcium 4.7 (4.7-5.5), PTH 27 12/23/2021: Calcium 8.7, magnesium 2.1, both normal 12/09/2021: Ionized calcium 4.7 (4.8-5.6) Lab Results  Component Value Date   PTH 27 01/08/2022   CALCIUM 8.5 (L) 06/19/2022   CALCIUM 8.1 (L) 09/05/2021   CALCIUM 8.3 (L) 05/17/2021   CALCIUM 9.9 02/02/2020  06/09/2021: Calcium 9.1 06/03/2021: Calcium 9.2, vitamin D 40.2  At last visit she was on 600 mg Citracal with vitamin D BID -increased 08/2021. Currently on Citracal so slow increase  1200 mg -1000 IU - 2x a day and also on ergocalciferol 50,000 units weekly  Vitamin D insufficiency: Reviewed vitamin D levels: 07/10/2022: Vitamin D 50 03/09/2022: vitamin D 37 01/05/2022: Vitamin D 25.3 Lab Results  Component Value Date   VD25OH 38.23 12/09/2021   VD25OH 28.03 (L) 10/30/2021  She takes vitamin D in MVIs and Citracal.  Also, PCP added 50,000 units Ergocalciferol once a week.   No FH of thyroid cancer.  No family history of thyroid disease.   No h/o radiation tx to head or neck except for RAI treatment.  During investigation for fatigue, she was found to have severe B12 deficiency: 12/23/2021: B12 481 Lab Results  Component Value Date   VITAMINB12 93 (L) 12/09/2021   She is currently on weekly B12 injections.  ROS:  + see HPI  Past Medical History:  Diagnosis Date   Allergy 2018   Alllergies   Anemia varying at times   Anxiety varying at times   Cancer (HCC) may 2022   PTC   GERD (gastroesophageal reflux disease)    Postpartum depression    Thyroid disease May 2022- PTC   Thyroidectomy   Past Surgical History:  Procedure Laterality Date   CESAREAN SECTION  2010   LIVER RESECTION  2015   LIVER RESECTION  09/2013   THYROIDECTOMY N/A 05/16/2021   Procedure: TOTAL THYROIDECTOMY WITH LIMITED LYMPH NODE DISSECTION;  Surgeon: Gerkin, Todd, MD;  Location: [facility];  Service: General;  Laterality: N/A;   Social  History   Socioeconomic History   Marital status: Married    Spouse name: Not on file   Number of children: 1   Years of education: Not on file   Highest education level: Not on file  Occupational History   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  Tobacco Use   Smoking status: Never   Smokeless tobacco: Never  Vaping Use   Vaping Use: Never used  Substance and Sexual Activity   Alcohol use: Yes    Alcohol/week: 6.0 standard drinks of alcohol    Types: 6 Glasses of wine per week    Comment: Socially   Drug use: No   Sexual activity: Yes    Birth control/protection: Other-see comments    Comment: Husband- Vesictomy  Other Topics Concern   Not on file  Social History Narrative   Not on file   Social Determinants of Health   Financial Resource Strain: Not on file  Food Insecurity: Not on file  Transportation Needs: Not on file  Physical Activity: Not on file  Stress: Not on file  Social Connections: Not on file  Intimate Partner Violence: Not on file   Current Outpatient Medications on File Prior to Visit  Medication Sig Dispense Refill   ALPRAZolam (XANAX) 0.5 MG tablet Take 0.5 mg by mouth daily as needed for anxiety or sleep.     buPROPion (WELLBUTRIN XL) 150 MG 24 hr tablet Take 150 mg by mouth daily.     butalbital-acetaminophen-caffeine (FIORICET) 50-325-40 MG tablet Take 1-2 tablets by mouth every 6 (six) hours as needed for headache. 20 tablet 0   Fexofenadine HCl (MUCINEX ALLERGY PO) Take by mouth.     fluticasone (FLONASE) 50 MCG/ACT nasal spray Place 2 sprays into both nostrils daily.     guaiFENesin (MUCINEX) 600 MG 12 hr tablet Take 1,200 mg by mouth daily.     lisdexamfetamine (VYVANSE) 30 MG capsule Take 30 mg by mouth every morning. Vyvanse     Olopatadine HCl 0.2 % SOLN Place 1 drop into both eyes daily.     omeprazole (PRILOSEC) 20 MG capsule TAKE 1 CAPSULE BY MOUTH EVERY DAY (Patient taking differently: Take 20 mg by mouth daily.) 30  capsule 5   Sodium Chloride-Xylitol (XLEAR SINUS CARE SPRAY NA) Place 1 spray into the nose daily as needed (help drain).     SYNTHROID 150 MCG tablet Take 1 tablet (150 mcg total) by mouth daily before breakfast. 45 tablet 3   SYRINGE-NEEDLE, DISP, 3   ML (BD ECLIPSE SYRINGE/NEEDLE) 25G X 5/8" 3 ML MISC Use 1x a month 12 each 1   Vitamin D, Ergocalciferol, (DRISDOL) 1.25 MG (50000 UNIT) CAPS capsule Take 50,000 Units by mouth once a week.     Current Facility-Administered Medications on File Prior to Visit  Medication Dose Route Frequency Provider Last Rate Last Admin   cyanocobalamin (VITAMIN B12) injection 1,000 mcg  1,000 mcg Intramuscular Q30 days Philemon Kingdom, MD   1,000 mcg at 09/11/22 1508   No Known Allergies  Family History  Problem Relation Age of Onset   Hypertension Mother    ADD / ADHD Mother    Varicose Veins Mother    GER disease Father    Alcohol abuse Father    Breast cancer Maternal Grandmother    Cancer Maternal Grandmother    COPD Maternal Grandmother    Congestive Heart Failure Maternal Grandfather    Cancer Maternal Grandfather    Learning disabilities Maternal Grandfather    Early death Paternal Grandfather    Early death Paternal 46    ADD / ADHD Brother    Learning disabilities Brother    Varicose Veins Brother    Cancer Maternal 25 / Stillbirths Sister    PE: BP 120/82 (BP Location: Left Arm, Patient Position: Sitting, Cuff Size: Normal)   Pulse 76   Ht 5' 4" (1.626 m)   Wt 147 lb 3.2 oz (66.8 kg)   SpO2 99%   BMI 25.27 kg/m  Wt Readings from Last 3 Encounters:  09/28/22 147 lb 3.2 oz (66.8 kg)  05/12/22 147 lb 12.8 oz (67 kg)  01/08/22 146 lb (66.2 kg)   Constitutional: normal weight, in NAD Eyes: EOMI, no exophthalmos ENT: no neck masses palpated, no cervical lymphadenopathy Cardiovascular: RRR, No MRG Respiratory: CTA B Musculoskeletal: no deformities Skin: no rashes Neurological: no tremor with  outstretched hands  ASSESSMENT: 1.  Papillary thyroid cancer - see HPI  2. Postsurgical Hypothyroidism  3.  Postsurgical hypocalcemia  4.  Vitamin D insufficiency  5.  Vitamin B12 deficiency  PLAN:  1.  Papillary thyroid cancer - with positive BRAF V600E [id number] T>A mutation -Her thyroid cancer was metastatic, but she is clinically stage I TNM due to age, despite the large tumor and lymph node metastasis. - I reassured her that papillary thyroid cancer is a slow-growing cancer with good prognosis. We discussed about the BRAF mutation as confering her higher risk for recurrence, however, this is very common among patients with papillary thyroid cancer (between 87 and 80% incidence, depending on the study) and new studies show that he can increase mortality and recurrence mostly in patients older than 46 years old. -Since the tumor was large with lymph node metastasis, RAI treatment was indicated for postop thyroid remnant ablation. -she had RAI treatment on 05/14/2021 and then posttreatment whole-body scan on 08/18/2021.  There were no sites of metastases observed on the scan.  Unfortunately, she developed radiation parotiditis.  She has seen multiple specialists and had many appointments.  She had steroids and antibiotics and was planning to surgery before last visit.  She saw ENT at [facility]. -She would not be a good candidate for another RAI treatment in the future -At last visit thyroglobulin level was low but still detectable at 0.3, while ATA antibodies were undetectable -We will recheck these today - I will then see the patient in 6 months  2.  Patient with h/o total thyroidectomy for cancer, now with iatrogenic  hypothyroidism, on Synthroid d.a.w.  Levothyroxine generic did not work well for her, as she was still symptomatic with hypothyroid symptoms on it. - latest thyroid labs reviewed with pt. >> normal: Lab Results  Component Value Date   TSH 0.41 06/12/2022  - she continues  on LT4 150 mcg daily, increased after the above results return to keep a lower TSH in the setting of metastatic thyroid cancer, at least for the first year after RAI treatment. - pt feels good on this dose. - we discussed about taking the thyroid hormone every day, with water, >30 minutes before breakfast, separated by >4 hours from acid reflux medications, calcium, iron, multivitamins. Pt. is taking it correctly. - will check thyroid tests today: TSH and fT4 - If labs are abnormal, she will need to return for repeat TFTs in 1.5 months  3.  Postsurgical hypocalcemia -Calcium was slightly low right after surgery but then improved -We checked an ionized calcium and this was normal, at 4.7 (lower limit of normal for the assay was 4.7).  PTH was normal, at 27.  In 02/2022, she had another ionized calcium and this was again 4.7 (lower limit of normal 4.5).  -Most recent calcium level is from 07/10/2022 and this was low normal, at 8.8 (8.7-10.4) -She did denies perioral numbness or acral cramping  4.  Vitamin D insufficiency -vitamin D level was 25.3, in 11/2021.  However, there is no mellitus 02/2022.  At last check, in 06/2022, vitamin D was at goal, at 32. -At last visit she was taking multivitamins and Citracal and PCP also added ergocalciferol 50,000 units weekly.  She continues on this regimen  5.  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  She will think about this and let me know. -Latest B12 level was normal on 12/23/2021, and 481  She prefers 60 day supply of LT4.  Philemon Kingdom, MD PhD [facility]

## 2022-09-28 NOTE — Progress Notes (Unsigned)
Patient verbally confirmed name, date of birth, and correct medication to be administered. B-12 injection administered and pt tolerated well.  

## 2022-09-29 LAB — THYROGLOBULIN ANTIBODY: Thyroglobulin Ab: <1 IU/mL (ref <=1)

## 2022-09-29 LAB — THYROGLOBULIN LEVEL: Thyroglobulin: 0.2 ng/mL — ABNORMAL LOW

## 2022-09-29 LAB — TSH: TSH: 0.04 u[IU]/mL — ABNORMAL LOW (ref 0.35–5.50)

## 2022-09-29 LAB — T4, FREE: Free T4: 1.11 ng/dL (ref 0.60–1.60)

## 2022-10-01 MED ORDER — SYNTHROID 150 MCG PO TABS: 150.0000 ug | ORAL_TABLET | ORAL | 3 refills | Status: DC

## 2022-10-01 MED ORDER — SYNTHROID 137 MCG PO TABS: 137.0000 ug | ORAL_TABLET | ORAL | 3 refills | Status: DC

## 2022-10-09 ENCOUNTER — Ambulatory Visit (INDEPENDENT_AMBULATORY_CARE_PROVIDER_SITE_OTHER): Payer: BC Managed Care – PPO

## 2022-10-09 VITALS — BP 140/80 | HR 60

## 2022-10-09 DIAGNOSIS — E538 Deficiency of other specified B group vitamins: Secondary | ICD-10-CM

## 2022-10-09 MED ORDER — CYANOCOBALAMIN 1000 MCG/ML IJ SOLN
1000.0000 ug | Freq: Once | INTRAMUSCULAR | Status: AC
  Administered 2022-10-09: 1000 ug via INTRAMUSCULAR

## 2022-10-09 NOTE — Progress Notes (Signed)
Patient verbally confirmed name, date of birth, and correct medication to be administered. B12 Injection administered and pt tolerated well.

## 2022-10-16 ENCOUNTER — Ambulatory Visit (INDEPENDENT_AMBULATORY_CARE_PROVIDER_SITE_OTHER): Payer: BC Managed Care – PPO

## 2022-10-16 VITALS — BP 128/82 | HR 71

## 2022-10-16 DIAGNOSIS — E538 Deficiency of other specified B group vitamins: Secondary | ICD-10-CM | POA: Diagnosis not present

## 2022-10-16 MED ORDER — CYANOCOBALAMIN 1000 MCG/ML IJ SOLN
1000.0000 ug | Freq: Once | INTRAMUSCULAR | Status: AC
  Administered 2022-10-16: 1000 ug via INTRAMUSCULAR

## 2022-10-16 NOTE — Progress Notes (Signed)
Patient verbally confirmed name, date of birth, and correct medication to be administered. B12 Injection administered and pt tolerated well.

## 2022-10-23 ENCOUNTER — Ambulatory Visit (INDEPENDENT_AMBULATORY_CARE_PROVIDER_SITE_OTHER): Payer: BC Managed Care – PPO

## 2022-10-23 VITALS — BP 138/84 | HR 68

## 2022-10-23 DIAGNOSIS — E538 Deficiency of other specified B group vitamins: Secondary | ICD-10-CM

## 2022-10-23 MED ORDER — CYANOCOBALAMIN 1000 MCG/ML IJ SOLN
1000.0000 ug | Freq: Once | INTRAMUSCULAR | Status: AC
  Administered 2022-10-23: 1000 ug via INTRAMUSCULAR

## 2022-10-23 NOTE — Progress Notes (Signed)
Patient verbally confirmed name, date of birth, and correct medication to be administered. B12 Injection administered and pt tolerated well.

## 2022-10-30 ENCOUNTER — Ambulatory Visit (INDEPENDENT_AMBULATORY_CARE_PROVIDER_SITE_OTHER): Payer: BC Managed Care – PPO

## 2022-10-30 VITALS — BP 124/74

## 2022-10-30 DIAGNOSIS — E538 Deficiency of other specified B group vitamins: Secondary | ICD-10-CM

## 2022-10-30 MED ORDER — CYANOCOBALAMIN 1000 MCG/ML IJ SOLN
1000.0000 ug | Freq: Once | INTRAMUSCULAR | Status: AC
  Administered 2022-10-30: 1000 ug via INTRAMUSCULAR

## 2022-10-30 NOTE — Progress Notes (Signed)
Patient verbally confirmed name, date of birth, and correct medication to be administered. B-12 injection administered and pt tolerated well.  

## 2022-11-06 ENCOUNTER — Ambulatory Visit (INDEPENDENT_AMBULATORY_CARE_PROVIDER_SITE_OTHER): Payer: BC Managed Care – PPO

## 2022-11-06 VITALS — BP 138/84

## 2022-11-06 DIAGNOSIS — E538 Deficiency of other specified B group vitamins: Secondary | ICD-10-CM | POA: Diagnosis not present

## 2022-11-06 MED ORDER — CYANOCOBALAMIN 1000 MCG/ML IJ SOLN
1000.0000 ug | Freq: Once | INTRAMUSCULAR | Status: AC
  Administered 2022-11-06: 1000 ug via INTRAMUSCULAR

## 2022-11-06 NOTE — Progress Notes (Signed)
Patient verbally confirmed name, date of birth, and correct medication to be administered. B12 Injection administered and pt tolerated well.

## 2022-11-13 ENCOUNTER — Ambulatory Visit (INDEPENDENT_AMBULATORY_CARE_PROVIDER_SITE_OTHER): Payer: BC Managed Care – PPO

## 2022-11-13 VITALS — BP 140/88 | HR 71

## 2022-11-13 DIAGNOSIS — E538 Deficiency of other specified B group vitamins: Secondary | ICD-10-CM

## 2022-11-13 MED ORDER — CYANOCOBALAMIN 1000 MCG/ML IJ SOLN
1000.0000 ug | Freq: Once | INTRAMUSCULAR | Status: AC
  Administered 2022-11-13: 1000 ug via INTRAMUSCULAR

## 2022-11-13 NOTE — Progress Notes (Signed)
Vitamin B12 1000 mch/mL IM injection given today, patient tolerated well. ° °

## 2022-11-20 ENCOUNTER — Other Ambulatory Visit: Payer: BC Managed Care – PPO

## 2022-11-20 ENCOUNTER — Ambulatory Visit (INDEPENDENT_AMBULATORY_CARE_PROVIDER_SITE_OTHER): Payer: BC Managed Care – PPO

## 2022-11-20 DIAGNOSIS — E538 Deficiency of other specified B group vitamins: Secondary | ICD-10-CM

## 2022-11-20 DIAGNOSIS — E890 Postprocedural hypothyroidism: Secondary | ICD-10-CM

## 2022-11-20 MED ORDER — CYANOCOBALAMIN 1000 MCG/ML IJ SOLN
1000.0000 ug | Freq: Once | INTRAMUSCULAR | Status: AC
  Administered 2022-11-20: 1000 ug via INTRAMUSCULAR

## 2022-11-20 NOTE — Progress Notes (Signed)
Vitamin B12 1000 mch/mL IM injection given today, patient tolerated well.  Patient verified name, DOB and provided verbal consent prior to administration. 

## 2022-11-20 NOTE — Addendum Note (Signed)
Addended by: Nils Flack I on: 11/20/2022 02:02 PM   Modules accepted: Orders

## 2022-11-21 LAB — TSH: TSH: 0.16 mIU/L — ABNORMAL LOW

## 2022-11-21 LAB — T4, FREE: Free T4: 1.3 ng/dL (ref 0.8–1.8)

## 2022-11-27 ENCOUNTER — Ambulatory Visit (INDEPENDENT_AMBULATORY_CARE_PROVIDER_SITE_OTHER): Payer: BC Managed Care – PPO

## 2022-11-27 VITALS — Ht 64.0 in | Wt 145.0 lb

## 2022-11-27 DIAGNOSIS — E538 Deficiency of other specified B group vitamins: Secondary | ICD-10-CM

## 2022-11-27 MED ORDER — CYANOCOBALAMIN 1000 MCG/ML IJ SOLN
1000.0000 ug | INTRAMUSCULAR | Status: DC
  Administered 2022-11-27: 1000 ug via INTRAMUSCULAR

## 2022-11-27 NOTE — Progress Notes (Signed)
After obtaining consent, and per orders of Dr. Cruzita Lederer, injection of B-12  given by Deeksha Cotrell L Bracy Pepper left ventrogluteal  . Patient instructed to remain in clinic for 20 minutes afterwards, and to report any adverse reaction to me immediately.

## 2022-11-30 ENCOUNTER — Encounter: Payer: Self-pay | Admitting: Internal Medicine

## 2022-12-01 ENCOUNTER — Other Ambulatory Visit: Payer: Self-pay | Admitting: Internal Medicine

## 2022-12-01 MED ORDER — "BD ECLIPSE SYRINGE/NEEDLE 25G X 5/8"" 3 ML MISC": 1 refills | Status: AC

## 2022-12-01 MED ORDER — CYANOCOBALAMIN 1000 MCG/ML IJ SOLN: 1000.0000 ug | INTRAMUSCULAR | 2 refills | Status: AC

## 2022-12-04 ENCOUNTER — Ambulatory Visit (INDEPENDENT_AMBULATORY_CARE_PROVIDER_SITE_OTHER): Payer: BC Managed Care – PPO

## 2022-12-04 VITALS — BP 110/70 | Ht 64.0 in | Wt 145.0 lb

## 2022-12-04 DIAGNOSIS — E538 Deficiency of other specified B group vitamins: Secondary | ICD-10-CM | POA: Diagnosis not present

## 2022-12-04 MED ORDER — CYANOCOBALAMIN 1000 MCG/ML IJ SOLN
1000.0000 ug | Freq: Once | INTRAMUSCULAR | Status: AC
  Administered 2022-12-04: 1000 ug via INTRAMUSCULAR

## 2022-12-04 NOTE — Progress Notes (Signed)
After obtaining consent, and per orders of Dr. Cruzita Lederer, injection of Cyanocobalamin 20m given by Shaun Runyon L Errol Ala left Ventrogluteal. Patient instructed to remain in clinic for 20 minutes afterwards, and to report any adverse reaction to me immediately.

## 2022-12-09 ENCOUNTER — Ambulatory Visit (INDEPENDENT_AMBULATORY_CARE_PROVIDER_SITE_OTHER): Payer: BC Managed Care – PPO

## 2022-12-09 VITALS — BP 106/78 | HR 60

## 2022-12-09 DIAGNOSIS — E538 Deficiency of other specified B group vitamins: Secondary | ICD-10-CM

## 2022-12-09 MED ORDER — CYANOCOBALAMIN 1000 MCG/ML IJ SOLN
1000.0000 ug | Freq: Once | INTRAMUSCULAR | Status: AC
  Administered 2022-12-09: 1000 ug via INTRAMUSCULAR

## 2022-12-09 NOTE — Progress Notes (Signed)
Vitamin B12 1000 mch/mL IM injection given today, patient tolerated well.  Patient verified name, DOB and provided verbal consent prior to administration.

## 2022-12-11 ENCOUNTER — Ambulatory Visit: Payer: BC Managed Care – PPO

## 2022-12-18 ENCOUNTER — Ambulatory Visit (INDEPENDENT_AMBULATORY_CARE_PROVIDER_SITE_OTHER): Payer: BC Managed Care – PPO

## 2022-12-18 VITALS — BP 132/90 | HR 82

## 2022-12-18 DIAGNOSIS — E538 Deficiency of other specified B group vitamins: Secondary | ICD-10-CM | POA: Diagnosis not present

## 2022-12-18 MED ORDER — CYANOCOBALAMIN 1000 MCG/ML IJ SOLN
1000.0000 ug | Freq: Once | INTRAMUSCULAR | Status: AC
  Administered 2022-12-18: 1000 ug via INTRAMUSCULAR

## 2022-12-18 NOTE — Progress Notes (Signed)
Vitamin B12 1000 mch/mL IM injection given today, patient tolerated well.  Patient verified name, DOB and provided verbal consent prior to administration.

## 2022-12-23 ENCOUNTER — Ambulatory Visit (INDEPENDENT_AMBULATORY_CARE_PROVIDER_SITE_OTHER): Payer: BC Managed Care – PPO

## 2022-12-23 VITALS — BP 124/82 | HR 77

## 2022-12-23 DIAGNOSIS — E538 Deficiency of other specified B group vitamins: Secondary | ICD-10-CM

## 2022-12-23 MED ORDER — CYANOCOBALAMIN 1000 MCG/ML IJ SOLN
1000.0000 ug | Freq: Once | INTRAMUSCULAR | Status: AC
  Administered 2022-12-23: 1000 ug via INTRAMUSCULAR

## 2022-12-23 NOTE — Progress Notes (Signed)
Vitamin B12 1000 mch/mL IM injection given today, patient tolerated well.  Patient verified name, DOB and provided verbal consent prior to administration.

## 2023-01-08 ENCOUNTER — Ambulatory Visit (INDEPENDENT_AMBULATORY_CARE_PROVIDER_SITE_OTHER): Payer: BC Managed Care – PPO

## 2023-01-08 VITALS — BP 122/84 | HR 76

## 2023-01-08 DIAGNOSIS — E538 Deficiency of other specified B group vitamins: Secondary | ICD-10-CM

## 2023-01-08 MED ORDER — CYANOCOBALAMIN 1000 MCG/ML IJ SOLN
1000.0000 ug | Freq: Once | INTRAMUSCULAR | Status: AC
  Administered 2023-01-08: 1000 ug via INTRAMUSCULAR

## 2023-01-08 NOTE — Progress Notes (Signed)
Vitamin B12 1000 mch/mL IM injection given today, patient tolerated well.  Patient verified name, DOB and provided verbal consent prior to administration. 

## 2023-01-15 ENCOUNTER — Ambulatory Visit (INDEPENDENT_AMBULATORY_CARE_PROVIDER_SITE_OTHER): Payer: BC Managed Care – PPO

## 2023-01-15 VITALS — BP 118/86 | HR 69

## 2023-01-15 DIAGNOSIS — E538 Deficiency of other specified B group vitamins: Secondary | ICD-10-CM | POA: Diagnosis not present

## 2023-01-15 MED ORDER — CYANOCOBALAMIN 1000 MCG/ML IJ SOLN
1000.0000 ug | Freq: Once | INTRAMUSCULAR | Status: AC
  Administered 2023-01-15: 1000 ug via INTRAMUSCULAR

## 2023-01-15 NOTE — Progress Notes (Signed)
Vitamin B12 1000 mch/mL IM injection given today, patient tolerated well. ° °

## 2023-01-20 ENCOUNTER — Ambulatory Visit (INDEPENDENT_AMBULATORY_CARE_PROVIDER_SITE_OTHER): Payer: BC Managed Care – PPO | Admitting: *Deleted

## 2023-01-20 VITALS — BP 123/70 | HR 69 | Ht 64.0 in | Wt 147.0 lb

## 2023-01-20 DIAGNOSIS — E538 Deficiency of other specified B group vitamins: Secondary | ICD-10-CM | POA: Diagnosis not present

## 2023-01-20 MED ORDER — CYANOCOBALAMIN 1000 MCG/ML IJ SOLN
1000.0000 ug | Freq: Once | INTRAMUSCULAR | Status: AC
  Administered 2023-01-20: 1000 ug via INTRAMUSCULAR

## 2023-01-20 NOTE — Progress Notes (Signed)
Vitamin B12 1000 mch/mL IM injection given today, patient tolerated well. ° °

## 2023-01-27 ENCOUNTER — Ambulatory Visit (INDEPENDENT_AMBULATORY_CARE_PROVIDER_SITE_OTHER): Payer: BC Managed Care – PPO

## 2023-01-27 VITALS — BP 122/80 | HR 80 | Ht 64.0 in | Wt 147.0 lb

## 2023-01-27 DIAGNOSIS — E538 Deficiency of other specified B group vitamins: Secondary | ICD-10-CM | POA: Diagnosis not present

## 2023-01-27 MED ORDER — CYANOCOBALAMIN 1000 MCG/ML IJ SOLN
1000.0000 ug | Freq: Once | INTRAMUSCULAR | Status: AC
  Administered 2023-01-27: 1000 ug via INTRAMUSCULAR

## 2023-01-27 NOTE — Progress Notes (Signed)
Patient verbally confirmed name, date of birth, and correct medication to be administered. B-12 injection administered and pt tolerated well.  

## 2023-01-29 ENCOUNTER — Ambulatory Visit: Payer: BC Managed Care – PPO

## 2023-02-05 ENCOUNTER — Ambulatory Visit (INDEPENDENT_AMBULATORY_CARE_PROVIDER_SITE_OTHER): Payer: BC Managed Care – PPO

## 2023-02-05 VITALS — BP 110/68 | Wt 147.0 lb

## 2023-02-05 DIAGNOSIS — E538 Deficiency of other specified B group vitamins: Secondary | ICD-10-CM | POA: Diagnosis not present

## 2023-02-05 MED ORDER — CYANOCOBALAMIN 1000 MCG/ML IJ SOLN
1000.0000 ug | Freq: Once | INTRAMUSCULAR | Status: AC
  Administered 2023-02-05: 1000 ug via INTRAMUSCULAR

## 2023-02-05 NOTE — Progress Notes (Signed)
Patient verbally confirmed name, date of birth, and correct medication to be administered. B-12 injection administered and pt tolerated well.  

## 2023-02-12 ENCOUNTER — Ambulatory Visit (INDEPENDENT_AMBULATORY_CARE_PROVIDER_SITE_OTHER): Payer: BC Managed Care – PPO

## 2023-02-12 ENCOUNTER — Other Ambulatory Visit: Payer: BC Managed Care – PPO

## 2023-02-12 VITALS — BP 122/80 | HR 68 | Ht 64.0 in | Wt 147.0 lb

## 2023-02-12 DIAGNOSIS — E890 Postprocedural hypothyroidism: Secondary | ICD-10-CM

## 2023-02-12 DIAGNOSIS — E538 Deficiency of other specified B group vitamins: Secondary | ICD-10-CM

## 2023-02-12 MED ORDER — CYANOCOBALAMIN 1000 MCG/ML IJ SOLN
1000.0000 ug | Freq: Once | INTRAMUSCULAR | Status: AC
Start: 2023-02-12 — End: 2023-02-12
  Administered 2023-02-12: 1000 ug via INTRAMUSCULAR

## 2023-02-12 NOTE — Addendum Note (Signed)
Addended by: Aking Klabunde F on: 02/12/2023 02:51 PM   Modules accepted: Orders  

## 2023-02-12 NOTE — Addendum Note (Signed)
Addended by: Cleda Mccreedy F on: 02/12/2023 02:51 PM   Modules accepted: Orders

## 2023-02-12 NOTE — Progress Notes (Signed)
Patient verbally confirmed name, date of birth, and correct medication to be administered. B-12 injection administered and pt tolerated well.  

## 2023-02-13 LAB — T4, FREE: Free T4: 1.5 ng/dL (ref 0.8–1.8)

## 2023-02-13 LAB — TSH: TSH: 0.3 mIU/L — ABNORMAL LOW

## 2023-02-15 ENCOUNTER — Encounter: Payer: Self-pay | Admitting: Internal Medicine

## 2023-02-22 ENCOUNTER — Encounter: Payer: Self-pay | Admitting: Internal Medicine

## 2023-02-22 MED ORDER — SYNTHROID 137 MCG PO TABS: 137.0000 ug | ORAL_TABLET | ORAL | 3 refills | Status: AC

## 2023-02-22 MED ORDER — SYNTHROID 150 MCG PO TABS: 150.0000 ug | ORAL_TABLET | ORAL | 3 refills | Status: AC

## 2023-02-26 ENCOUNTER — Ambulatory Visit (INDEPENDENT_AMBULATORY_CARE_PROVIDER_SITE_OTHER): Payer: BC Managed Care – PPO

## 2023-02-26 VITALS — BP 138/80 | HR 78 | Ht 64.0 in | Wt 142.0 lb

## 2023-02-26 DIAGNOSIS — E538 Deficiency of other specified B group vitamins: Secondary | ICD-10-CM | POA: Diagnosis not present

## 2023-02-26 MED ORDER — CYANOCOBALAMIN 1000 MCG/ML IJ SOLN
1000.0000 ug | Freq: Once | INTRAMUSCULAR | Status: AC
Start: 2023-02-26 — End: 2023-02-26
  Administered 2023-02-26: 1000 ug via INTRAMUSCULAR

## 2023-02-26 NOTE — Progress Notes (Signed)
After obtaining consent, and per orders of Dr.Gherghe, injection of Vitamin B 12 given by [name] in R Ventrogluteal. Patient instructed to remain in clinic for 20 minutes afterwards, and to report any adverse reaction to me immediately.

## 2023-03-05 ENCOUNTER — Ambulatory Visit (INDEPENDENT_AMBULATORY_CARE_PROVIDER_SITE_OTHER): Payer: BC Managed Care – PPO

## 2023-03-05 VITALS — BP 120/80 | HR 65 | Ht 64.0 in | Wt 147.0 lb

## 2023-03-05 DIAGNOSIS — E538 Deficiency of other specified B group vitamins: Secondary | ICD-10-CM

## 2023-03-05 MED ORDER — CYANOCOBALAMIN 1000 MCG/ML IJ SOLN
1000.0000 ug | Freq: Once | INTRAMUSCULAR | Status: AC
Start: 2023-03-05 — End: 2023-03-05
  Administered 2023-03-05: 1000 ug via INTRAMUSCULAR

## 2023-03-05 MED ORDER — CYANOCOBALAMIN 1000 MCG/ML IJ SOLN
1000.0000 ug | Freq: Once | INTRAMUSCULAR | Status: DC
Start: 2023-03-05 — End: 2023-03-05

## 2023-03-05 NOTE — Progress Notes (Signed)
After obtaining consent, and per orders of Dr.Gherghe, injection of Vitamin B12 1093mcg/mL given by [name] Left Ventrogluteal . Patient instructed to remain in clinic for 20 minutes afterwards, and to report any adverse reaction to me immediately.

## 2023-03-12 ENCOUNTER — Ambulatory Visit (INDEPENDENT_AMBULATORY_CARE_PROVIDER_SITE_OTHER): Payer: BC Managed Care – PPO

## 2023-03-12 VITALS — BP 136/88 | Ht 64.0 in | Wt 145.8 lb

## 2023-03-12 DIAGNOSIS — E538 Deficiency of other specified B group vitamins: Secondary | ICD-10-CM

## 2023-03-12 MED ORDER — CYANOCOBALAMIN 1000 MCG/ML IJ SOLN
1000.0000 ug | Freq: Once | INTRAMUSCULAR | Status: AC
Start: 2023-03-12 — End: 2023-03-12
  Administered 2023-03-12: 1000 ug via INTRAMUSCULAR

## 2023-03-12 NOTE — Progress Notes (Signed)
Sandra Roberson is a 47 y.o. presents today for injection of B12. Patient given 1000 mcg of B12 in Right ventrogluteal . No adverse reactions noted.

## 2023-03-19 ENCOUNTER — Ambulatory Visit (INDEPENDENT_AMBULATORY_CARE_PROVIDER_SITE_OTHER): Payer: BC Managed Care – PPO

## 2023-03-19 VITALS — BP 120/80 | HR 79 | Ht 64.0 in | Wt 146.6 lb

## 2023-03-19 DIAGNOSIS — E538 Deficiency of other specified B group vitamins: Secondary | ICD-10-CM | POA: Diagnosis not present

## 2023-03-19 MED ORDER — CYANOCOBALAMIN 1000 MCG/ML IJ SOLN
1000.0000 ug | Freq: Once | INTRAMUSCULAR | Status: AC
Start: 2023-03-19 — End: 2023-03-19
  Administered 2023-03-19: 1000 ug via INTRAMUSCULAR

## 2023-03-19 NOTE — Progress Notes (Signed)
After obtaining consent, and per orders of Dr. Elvera Lennox, injection of Vitamin b12 1000 mcg given by [name] in [place].Gluteal. Patient instructed to remain in clinic for 20 minutes afterwards, and to report any adverse reaction to me immediately.

## 2023-04-02 ENCOUNTER — Ambulatory Visit (INDEPENDENT_AMBULATORY_CARE_PROVIDER_SITE_OTHER): Payer: BC Managed Care – PPO

## 2023-04-02 VITALS — BP 124/60 | HR 84 | Ht 64.0 in | Wt 145.8 lb

## 2023-04-02 DIAGNOSIS — E538 Deficiency of other specified B group vitamins: Secondary | ICD-10-CM

## 2023-04-02 MED ORDER — CYANOCOBALAMIN 1000 MCG/ML IJ SOLN
1000.0000 ug | Freq: Once | INTRAMUSCULAR | Status: AC
Start: 2023-04-02 — End: 2023-04-02
  Administered 2023-04-02: 1000 ug via INTRAMUSCULAR

## 2023-04-02 NOTE — Progress Notes (Signed)
After obtaining consent, and per orders of Dr. Elvera Lennox, injection of Vitamin B12 1000 mcg/mL given by [name] in the R Ventrogluteal IM. Patient instructed to remain in clinic for 20 minutes afterwards, and to report any adverse reaction to me immediately.

## 2023-04-09 ENCOUNTER — Ambulatory Visit (INDEPENDENT_AMBULATORY_CARE_PROVIDER_SITE_OTHER): Payer: BC Managed Care – PPO

## 2023-04-09 VITALS — BP 140/70 | HR 76 | Ht 64.0 in | Wt 146.0 lb

## 2023-04-09 DIAGNOSIS — E538 Deficiency of other specified B group vitamins: Secondary | ICD-10-CM

## 2023-04-09 MED ORDER — CYANOCOBALAMIN 1000 MCG/ML IJ SOLN
1000.0000 ug | Freq: Once | INTRAMUSCULAR | Status: AC
Start: 2023-04-09 — End: 2023-04-09
  Administered 2023-04-09: 1000 ug via INTRAMUSCULAR

## 2023-04-09 NOTE — Progress Notes (Signed)
After obtaining consent, and per orders of Dr. Elvera Lennox, injection of B12 given by Rolly Salter. Patient instructed to remain in clinic for 20 minutes afterwards, and to report any adverse reaction to me immediately.

## 2023-04-13 ENCOUNTER — Ambulatory Visit: Payer: BC Managed Care – PPO | Admitting: Internal Medicine

## 2023-04-16 ENCOUNTER — Ambulatory Visit (INDEPENDENT_AMBULATORY_CARE_PROVIDER_SITE_OTHER): Payer: BC Managed Care – PPO

## 2023-04-16 VITALS — BP 102/70 | Ht 64.0 in | Wt 147.0 lb

## 2023-04-16 DIAGNOSIS — E538 Deficiency of other specified B group vitamins: Secondary | ICD-10-CM | POA: Diagnosis not present

## 2023-04-16 MED ORDER — CYANOCOBALAMIN 1000 MCG/ML IJ SOLN
1000.0000 ug | Freq: Once | INTRAMUSCULAR | Status: AC
Start: 2023-04-16 — End: 2023-04-16
  Administered 2023-04-16: 1000 ug via INTRAMUSCULAR

## 2023-04-16 NOTE — Progress Notes (Signed)
Patient verbally confirmed name, date of birth, and correct medication to be administered. B-12 injection administered and pt tolerated well.  

## 2023-04-21 ENCOUNTER — Ambulatory Visit (INDEPENDENT_AMBULATORY_CARE_PROVIDER_SITE_OTHER): Payer: BC Managed Care – PPO

## 2023-04-21 DIAGNOSIS — E538 Deficiency of other specified B group vitamins: Secondary | ICD-10-CM

## 2023-04-21 MED ORDER — CYANOCOBALAMIN 1000 MCG/ML IJ SOLN
1000.0000 ug | Freq: Once | INTRAMUSCULAR | Status: AC
Start: 2023-04-21 — End: 2023-04-21
  Administered 2023-04-21: 1000 ug via INTRAMUSCULAR

## 2023-04-21 NOTE — Progress Notes (Signed)
After obtaining consent, and per orders of Dr. Elvera Lennox, injection of Vitamin B12 given by [name]. Patient instructed to remain in clinic for 20 minutes afterwards, and to report any adverse reaction to me immediately.

## 2023-04-30 ENCOUNTER — Ambulatory Visit: Payer: BC Managed Care – PPO

## 2023-05-07 ENCOUNTER — Ambulatory Visit (INDEPENDENT_AMBULATORY_CARE_PROVIDER_SITE_OTHER): Payer: BC Managed Care – PPO

## 2023-05-07 DIAGNOSIS — E538 Deficiency of other specified B group vitamins: Secondary | ICD-10-CM | POA: Diagnosis not present

## 2023-05-07 MED ORDER — CYANOCOBALAMIN 1000 MCG/ML IJ SOLN
1000.0000 ug | Freq: Once | INTRAMUSCULAR | Status: AC
Start: 2023-05-07 — End: 2023-05-07
  Administered 2023-05-07: 1000 ug via INTRAMUSCULAR

## 2023-05-07 NOTE — Progress Notes (Signed)
After obtaining consent, and per orders of Dr. Elvera Lennox, injection of Vitamin b12 given by [name]. Patient instructed to remain in clinic for 20 minutes afterwards, and to report any adverse reaction to me immediately.

## 2023-05-14 ENCOUNTER — Ambulatory Visit (INDEPENDENT_AMBULATORY_CARE_PROVIDER_SITE_OTHER): Payer: BC Managed Care – PPO

## 2023-05-14 VITALS — BP 102/70 | Ht 64.0 in | Wt 147.0 lb

## 2023-05-14 DIAGNOSIS — E538 Deficiency of other specified B group vitamins: Secondary | ICD-10-CM | POA: Diagnosis not present

## 2023-05-14 MED ORDER — CYANOCOBALAMIN 1000 MCG/ML IJ SOLN
1000.0000 ug | Freq: Once | INTRAMUSCULAR | Status: AC
Start: 2023-05-14 — End: 2023-05-14
  Administered 2023-05-14: 1000 ug via INTRAMUSCULAR

## 2023-05-14 NOTE — Progress Notes (Signed)
After obtaining consent, and per orders of Dr. Elvera Lennox, injection of B12 given by Leota Sauers. Patient instructed to remain in clinic for 20 minutes afterwards, and to report any adverse reaction to me immediately.

## 2023-05-21 ENCOUNTER — Ambulatory Visit (INDEPENDENT_AMBULATORY_CARE_PROVIDER_SITE_OTHER): Payer: BC Managed Care – PPO

## 2023-05-21 VITALS — BP 100/70 | Ht 64.0 in | Wt 147.0 lb

## 2023-05-21 DIAGNOSIS — E538 Deficiency of other specified B group vitamins: Secondary | ICD-10-CM

## 2023-05-21 MED ORDER — CYANOCOBALAMIN 1000 MCG/ML IJ SOLN
1000.0000 ug | Freq: Once | INTRAMUSCULAR | Status: AC
Start: 2023-05-21 — End: 2023-05-21
  Administered 2023-05-21: 1000 ug via INTRAMUSCULAR

## 2023-05-21 NOTE — Progress Notes (Signed)
After obtaining consent, injection of B12 given by Dakin Madani L Marshia Tropea in left arm. Patient instructed to report any adverse reaction to me immediately.

## 2023-05-28 ENCOUNTER — Ambulatory Visit (INDEPENDENT_AMBULATORY_CARE_PROVIDER_SITE_OTHER): Payer: BC Managed Care – PPO

## 2023-05-28 VITALS — BP 112/74 | Ht 64.0 in | Wt 147.0 lb

## 2023-05-28 DIAGNOSIS — E538 Deficiency of other specified B group vitamins: Secondary | ICD-10-CM | POA: Diagnosis not present

## 2023-05-28 MED ORDER — CYANOCOBALAMIN 1000 MCG/ML IJ SOLN
1000.0000 ug | Freq: Once | INTRAMUSCULAR | Status: AC
Start: 2023-05-28 — End: 2023-05-28
  Administered 2023-05-28: 1000 ug via INTRAMUSCULAR

## 2023-05-28 NOTE — Progress Notes (Signed)
After obtaining consent, and per orders of Dr. Lonzo Cloud , injection of B12 given by  L  in right ventrogluteal . Patient instructed to remain in clinic for 20 minutes afterwards, and to report any adverse reaction to me immediately.

## 2023-06-04 ENCOUNTER — Ambulatory Visit (INDEPENDENT_AMBULATORY_CARE_PROVIDER_SITE_OTHER): Payer: BC Managed Care – PPO

## 2023-06-04 VITALS — BP 120/90 | HR 77 | Ht 64.0 in | Wt 146.0 lb

## 2023-06-04 DIAGNOSIS — E538 Deficiency of other specified B group vitamins: Secondary | ICD-10-CM

## 2023-06-04 MED ORDER — CYANOCOBALAMIN 1000 MCG/ML IJ SOLN
1000.0000 ug | Freq: Once | INTRAMUSCULAR | Status: AC
Start: 2023-06-04 — End: 2023-06-04
  Administered 2023-06-04: 1000 ug via INTRAMUSCULAR

## 2023-06-04 NOTE — Progress Notes (Signed)
After obtaining consent, and per orders of Dr. Elvera Lennox, injection of B12 given by Leota Sauers. Patient instructed to remain in clinic for 20 minutes afterwards, and to report any adverse reaction to me immediately.

## 2023-06-11 ENCOUNTER — Ambulatory Visit (INDEPENDENT_AMBULATORY_CARE_PROVIDER_SITE_OTHER): Payer: BC Managed Care – PPO

## 2023-06-11 VITALS — BP 120/80 | HR 60 | Ht 64.0 in | Wt 149.4 lb

## 2023-06-11 DIAGNOSIS — E538 Deficiency of other specified B group vitamins: Secondary | ICD-10-CM

## 2023-06-11 MED ORDER — CYANOCOBALAMIN 1000 MCG/ML IJ SOLN
1000.0000 ug | Freq: Once | INTRAMUSCULAR | Status: AC
Start: 2023-06-11 — End: 2023-06-11
  Administered 2023-06-11: 1000 ug via INTRAMUSCULAR

## 2023-06-11 NOTE — Progress Notes (Signed)
After obtaining consent, and per orders of Dr. Elvera Lennox, injection of B12 given by Leota Sauers. Patient instructed to remain in clinic for 20 minutes afterwards, and to report any adverse reaction to me immediately.

## 2023-06-18 ENCOUNTER — Ambulatory Visit (INDEPENDENT_AMBULATORY_CARE_PROVIDER_SITE_OTHER): Payer: BC Managed Care – PPO

## 2023-06-18 VITALS — BP 105/80 | HR 86 | Ht 64.0 in | Wt 146.2 lb

## 2023-06-18 DIAGNOSIS — E538 Deficiency of other specified B group vitamins: Secondary | ICD-10-CM

## 2023-06-18 MED ORDER — CYANOCOBALAMIN 1000 MCG/ML IJ SOLN
1000.0000 ug | Freq: Once | INTRAMUSCULAR | Status: AC
Start: 2023-06-18 — End: 2023-06-18
  Administered 2023-06-18: 1000 ug via INTRAMUSCULAR

## 2023-06-18 NOTE — Progress Notes (Signed)
After obtaining consent, and per orders of Dr. Elvera Lennox, injection of B12 given by Leota Sauers. Patient instructed to remain in clinic for 20 minutes afterwards, and to report any adverse reaction to me immediately.

## 2023-06-25 ENCOUNTER — Ambulatory Visit (INDEPENDENT_AMBULATORY_CARE_PROVIDER_SITE_OTHER): Payer: BC Managed Care – PPO

## 2023-06-25 VITALS — BP 100/75 | HR 84 | Wt 146.0 lb

## 2023-06-25 DIAGNOSIS — E538 Deficiency of other specified B group vitamins: Secondary | ICD-10-CM

## 2023-06-25 MED ORDER — CYANOCOBALAMIN 1000 MCG/ML IJ SOLN
1000.0000 ug | Freq: Once | INTRAMUSCULAR | Status: AC
Start: 2023-06-25 — End: 2023-06-25
  Administered 2023-06-25: 1000 ug via INTRAMUSCULAR

## 2023-06-25 NOTE — Progress Notes (Signed)
After obtaining consent, and per orders of Dr. Elvera Lennox, injection of B12 given by Leota Sauers. Patient instructed to remain in clinic for 20 minutes afterwards, and to report any adverse reaction to me immediately.

## 2023-07-02 ENCOUNTER — Ambulatory Visit (INDEPENDENT_AMBULATORY_CARE_PROVIDER_SITE_OTHER): Payer: BC Managed Care – PPO

## 2023-07-02 DIAGNOSIS — E538 Deficiency of other specified B group vitamins: Secondary | ICD-10-CM

## 2023-07-02 MED ORDER — CYANOCOBALAMIN 1000 MCG/ML IJ SOLN
1000.0000 ug | Freq: Once | INTRAMUSCULAR | Status: AC
Start: 2023-07-02 — End: 2023-07-02
  Administered 2023-07-02: 1000 ug via INTRAMUSCULAR

## 2023-07-02 NOTE — Progress Notes (Signed)
After obtaining consent, and per orders of Dr. Elvera Lennox, injection of B12 given by Leota Sauers. Patient instructed to remain in clinic for 20 minutes afterwards, and to report any adverse reaction to me immediately.

## 2023-07-05 ENCOUNTER — Other Ambulatory Visit (HOSPITAL_BASED_OUTPATIENT_CLINIC_OR_DEPARTMENT_OTHER): Payer: Self-pay | Admitting: Otolaryngology

## 2023-07-05 DIAGNOSIS — J329 Chronic sinusitis, unspecified: Secondary | ICD-10-CM

## 2023-07-13 ENCOUNTER — Ambulatory Visit (HOSPITAL_COMMUNITY)
Admission: RE | Admit: 2023-07-13 | Discharge: 2023-07-13 | Disposition: A | Discharge status: Home / Self Care | Payer: BC Managed Care – PPO | Source: Ambulatory Visit | Attending: Otolaryngology | Admitting: Otolaryngology

## 2023-07-13 DIAGNOSIS — J329 Chronic sinusitis, unspecified: Secondary | ICD-10-CM

## 2023-07-14 ENCOUNTER — Ambulatory Visit (INDEPENDENT_AMBULATORY_CARE_PROVIDER_SITE_OTHER): Payer: BC Managed Care – PPO

## 2023-07-14 DIAGNOSIS — E538 Deficiency of other specified B group vitamins: Secondary | ICD-10-CM | POA: Diagnosis not present

## 2023-07-14 MED ORDER — CYANOCOBALAMIN 1000 MCG/ML IJ SOLN
1000.0000 ug | Freq: Once | INTRAMUSCULAR | Status: AC
Start: 2023-07-14 — End: 2023-07-14
  Administered 2023-07-14: 1000 ug via INTRAMUSCULAR

## 2023-07-14 NOTE — Progress Notes (Signed)
After obtaining consent, and per orders of Dr. Elvera Lennox, injection of Vitamin B12 given by [name]. Patient instructed to remain in clinic for 20 minutes afterwards, and to report any adverse reaction to me immediately.

## 2023-07-23 ENCOUNTER — Ambulatory Visit (INDEPENDENT_AMBULATORY_CARE_PROVIDER_SITE_OTHER): Payer: BC Managed Care – PPO

## 2023-07-23 ENCOUNTER — Ambulatory Visit: Payer: BC Managed Care – PPO | Admitting: Internal Medicine

## 2023-07-23 DIAGNOSIS — E538 Deficiency of other specified B group vitamins: Secondary | ICD-10-CM

## 2023-07-23 MED ORDER — CYANOCOBALAMIN 1000 MCG/ML IJ SOLN
1000.0000 ug | Freq: Once | INTRAMUSCULAR | Status: AC
Start: 2023-07-23 — End: 2023-07-23
  Administered 2023-07-23: 1000 ug via INTRAMUSCULAR

## 2023-07-23 NOTE — Progress Notes (Signed)
After obtaining consent, and per orders of Dr. Elvera Lennox, injection of B12 given by Leota Sauers. Patient instructed to remain in clinic for 20 minutes afterwards, and to report any adverse reaction to me immediately.

## 2023-07-30 ENCOUNTER — Ambulatory Visit (INDEPENDENT_AMBULATORY_CARE_PROVIDER_SITE_OTHER): Payer: BC Managed Care – PPO

## 2023-07-30 VITALS — BP 133/83 | HR 66 | Ht 65.0 in | Wt 144.0 lb

## 2023-07-30 DIAGNOSIS — E538 Deficiency of other specified B group vitamins: Secondary | ICD-10-CM

## 2023-07-30 MED ORDER — CYANOCOBALAMIN 1000 MCG/ML IJ SOLN
1000.0000 ug | Freq: Once | INTRAMUSCULAR | Status: AC
Start: 2023-07-30 — End: 2023-07-30
  Administered 2023-07-30: 1000 ug via INTRAMUSCULAR

## 2023-07-30 NOTE — Progress Notes (Signed)
After obtaining consent, and per orders of Dr. Elvera Lennox, injection of B12 given by Leota Sauers. Patient instructed to remain in clinic for 20 minutes afterwards, and to report any adverse reaction to me immediately.

## 2023-08-06 ENCOUNTER — Ambulatory Visit: Payer: BC Managed Care – PPO

## 2023-08-06 DIAGNOSIS — E538 Deficiency of other specified B group vitamins: Secondary | ICD-10-CM | POA: Diagnosis not present

## 2023-08-06 MED ORDER — CYANOCOBALAMIN 1000 MCG/ML IJ SOLN
1000.0000 ug | Freq: Once | INTRAMUSCULAR | Status: AC
Start: 2023-08-06 — End: 2023-08-06
  Administered 2023-08-06: 1000 ug via INTRAMUSCULAR

## 2023-08-06 NOTE — Progress Notes (Signed)
After obtaining consent, and per orders of Dr. Elvera Lennox, injection of B12 given by [name]. Patient instructed to remain in clinic for 20 minutes afterwards, and to report any adverse reaction to me immediately.

## 2023-08-13 ENCOUNTER — Other Ambulatory Visit: Payer: Self-pay

## 2023-08-13 ENCOUNTER — Ambulatory Visit (INDEPENDENT_AMBULATORY_CARE_PROVIDER_SITE_OTHER): Payer: BC Managed Care – PPO

## 2023-08-13 DIAGNOSIS — E538 Deficiency of other specified B group vitamins: Secondary | ICD-10-CM | POA: Diagnosis not present

## 2023-08-13 MED ORDER — CYANOCOBALAMIN 1000 MCG/ML IJ SOLN
1000.0000 ug | Freq: Once | INTRAMUSCULAR | Status: AC
Start: 2023-08-13 — End: 2023-08-13
  Administered 2023-08-13: 1000 ug via INTRAMUSCULAR

## 2023-08-13 NOTE — Progress Notes (Signed)
After obtaining consent, and per orders of Dr. Elvera Lennox, injection of B12 given by Leota Sauers. Patient instructed to remain in clinic for 20 minutes afterwards, and to report any adverse reaction to me immediately.

## 2023-08-13 NOTE — Progress Notes (Deleted)
After obtaining consent, and per orders of Dr. Elvera Lennox, injection of B12 given by Leota Sauers. Patient instructed to remain in clinic for 20 minutes afterwards, and to report any adverse reaction to me immediately.

## 2023-08-19 ENCOUNTER — Ambulatory Visit: Payer: BC Managed Care – PPO | Admitting: Internal Medicine

## 2023-08-20 ENCOUNTER — Ambulatory Visit (INDEPENDENT_AMBULATORY_CARE_PROVIDER_SITE_OTHER): Payer: BC Managed Care – PPO

## 2023-08-20 DIAGNOSIS — E538 Deficiency of other specified B group vitamins: Secondary | ICD-10-CM | POA: Diagnosis not present

## 2023-08-20 MED ORDER — CYANOCOBALAMIN 1000 MCG/ML IJ SOLN
1000.0000 ug | Freq: Once | INTRAMUSCULAR | Status: AC
Start: 2023-08-20 — End: 2023-08-20
  Administered 2023-08-20: 1000 ug via INTRAMUSCULAR

## 2023-08-20 NOTE — Progress Notes (Signed)
After obtaining consent, and per orders of Dr. Elvera Lennox, injection of B12 given by [name]. Patient instructed to remain in clinic for 20 minutes afterwards, and to report any adverse reaction to me immediately.

## 2023-08-27 ENCOUNTER — Ambulatory Visit (INDEPENDENT_AMBULATORY_CARE_PROVIDER_SITE_OTHER): Payer: BC Managed Care – PPO

## 2023-08-27 DIAGNOSIS — E538 Deficiency of other specified B group vitamins: Secondary | ICD-10-CM | POA: Diagnosis not present

## 2023-08-30 MED ORDER — CYANOCOBALAMIN 1000 MCG/ML IJ SOLN
1000.0000 ug | Freq: Once | INTRAMUSCULAR | Status: AC
Start: 2023-08-30 — End: 2023-08-27
  Administered 2023-08-27: 1000 ug via INTRAMUSCULAR

## 2023-08-30 NOTE — Progress Notes (Signed)
After obtaining consent, and per orders of Dr. Elvera Lennox, injection of Vitamin B12 given by [name]. Patient instructed to remain in clinic for 20 minutes afterwards, and to report any adverse reaction to me immediately.

## 2023-09-03 ENCOUNTER — Ambulatory Visit (INDEPENDENT_AMBULATORY_CARE_PROVIDER_SITE_OTHER): Payer: BC Managed Care – PPO

## 2023-09-03 DIAGNOSIS — E538 Deficiency of other specified B group vitamins: Secondary | ICD-10-CM | POA: Diagnosis not present

## 2023-09-03 MED ORDER — CYANOCOBALAMIN 1000 MCG/ML IJ SOLN
1000.0000 ug | Freq: Once | INTRAMUSCULAR | Status: AC
  Administered 2023-09-03: 1000 ug via INTRAMUSCULAR

## 2023-09-03 NOTE — Progress Notes (Signed)
After obtaining consent, and per orders of Dr. Elvera Lennox, injection of B12 given by Leota Sauers. Patient instructed to remain in clinic for 20 minutes afterwards, and to report any adverse reaction to me immediately.

## 2023-09-10 ENCOUNTER — Other Ambulatory Visit: Payer: Self-pay

## 2023-09-10 ENCOUNTER — Ambulatory Visit (INDEPENDENT_AMBULATORY_CARE_PROVIDER_SITE_OTHER): Payer: BC Managed Care – PPO

## 2023-09-10 DIAGNOSIS — E538 Deficiency of other specified B group vitamins: Secondary | ICD-10-CM | POA: Diagnosis not present

## 2023-09-10 MED ORDER — CYANOCOBALAMIN 1000 MCG/ML IJ SOLN
1000.0000 ug | Freq: Once | INTRAMUSCULAR | Status: AC
  Administered 2023-09-10: 1000 ug via INTRAMUSCULAR

## 2023-09-10 NOTE — Progress Notes (Signed)
After obtaining consent, and per orders of Dr. Elvera Lennox, injection of B12 given by Leota Sauers. Patient instructed to remain in clinic for 20 minutes afterwards, and to report any adverse reaction to me immediately.

## 2023-09-13 MED ORDER — CYANOCOBALAMIN 1000 MCG/ML IJ SOLN
1000.0000 ug | Freq: Once | INTRAMUSCULAR | Status: AC
  Administered 2023-09-10: 1000 ug via INTRAMUSCULAR

## 2023-09-13 NOTE — Progress Notes (Signed)
After obtaining consent, and per orders of Dr. Elvera Lennox, injection of B12 given by Leota Sauers. Patient instructed to remain in clinic for 20 minutes afterwards, and to report any adverse reaction to me immediately.

## 2023-09-14 ENCOUNTER — Ambulatory Visit: Payer: BC Managed Care – PPO | Admitting: Internal Medicine

## 2023-09-17 ENCOUNTER — Ambulatory Visit (INDEPENDENT_AMBULATORY_CARE_PROVIDER_SITE_OTHER): Payer: BC Managed Care – PPO

## 2023-09-17 VITALS — BP 128/70 | HR 64 | Resp 20 | Ht 65.0 in | Wt 150.4 lb

## 2023-09-17 DIAGNOSIS — E538 Deficiency of other specified B group vitamins: Secondary | ICD-10-CM | POA: Diagnosis not present

## 2023-09-17 MED ORDER — CYANOCOBALAMIN 1000 MCG/ML IJ SOLN
1000.0000 ug | Freq: Once | INTRAMUSCULAR | Status: AC
  Administered 2023-09-17: 1000 ug via INTRAMUSCULAR

## 2023-09-17 NOTE — Progress Notes (Addendum)
After obtaining consent, and per orders of Dr. Elvera Lennox, injection of Vitamin B12 given by Tera Partridge. Patient instructed to remain in clinic for 20 minutes afterwards, and to report any adverse reaction to me immediately.

## 2023-09-17 NOTE — Addendum Note (Signed)
Addended by: Tera Partridge on: 09/17/2023 02:38 PM   Modules accepted: Orders

## 2023-09-17 NOTE — Progress Notes (Deleted)
Marland Kitchen  inj

## 2023-10-01 ENCOUNTER — Ambulatory Visit (INDEPENDENT_AMBULATORY_CARE_PROVIDER_SITE_OTHER): Payer: BC Managed Care – PPO

## 2023-10-01 DIAGNOSIS — E538 Deficiency of other specified B group vitamins: Secondary | ICD-10-CM

## 2023-10-01 MED ORDER — CYANOCOBALAMIN 1000 MCG/ML IJ SOLN
1000.0000 ug | Freq: Once | INTRAMUSCULAR | Status: AC
  Administered 2023-10-01: 1000 ug via INTRAMUSCULAR

## 2023-10-01 NOTE — Progress Notes (Signed)
After obtaining consent, and per orders of Dr. Elvera Lennox, injection of Vitamin B12 given by [name]. Patient instructed to remain in clinic for 20 minutes afterwards, and to report any adverse reaction to me immediately.

## 2023-10-08 ENCOUNTER — Ambulatory Visit (INDEPENDENT_AMBULATORY_CARE_PROVIDER_SITE_OTHER): Payer: BC Managed Care – PPO

## 2023-10-08 VITALS — BP 110/70 | HR 73 | Resp 20 | Wt 146.2 lb

## 2023-10-08 DIAGNOSIS — E538 Deficiency of other specified B group vitamins: Secondary | ICD-10-CM | POA: Diagnosis not present

## 2023-10-08 DIAGNOSIS — R7989 Other specified abnormal findings of blood chemistry: Secondary | ICD-10-CM

## 2023-10-08 MED ORDER — CYANOCOBALAMIN 1000 MCG/ML IJ SOLN
1000.0000 ug | Freq: Once | INTRAMUSCULAR | Status: AC
  Administered 2023-10-08: 1000 ug via INTRAMUSCULAR

## 2023-10-08 NOTE — Progress Notes (Signed)
 After obtaining consent, and per orders of Dr. Elvera Lennox, injection of Vitamin B12 given by Tera Partridge. Patient instructed to remain in clinic for 20 minutes afterwards, and to report any adverse reaction to me immediately.

## 2023-10-15 ENCOUNTER — Ambulatory Visit (INDEPENDENT_AMBULATORY_CARE_PROVIDER_SITE_OTHER): Payer: BC Managed Care – PPO

## 2023-10-15 DIAGNOSIS — E538 Deficiency of other specified B group vitamins: Secondary | ICD-10-CM

## 2023-10-21 MED ORDER — CYANOCOBALAMIN 1000 MCG/ML IJ SOLN
1000.0000 ug | Freq: Once | INTRAMUSCULAR | Status: AC
  Administered 2023-10-15: 1000 ug via INTRAMUSCULAR

## 2023-10-21 NOTE — Progress Notes (Signed)
After obtaining consent, and per orders of Dr. Elvera Lennox, injection of B12 given by [name]. Patient instructed to remain in clinic for 20 minutes afterwards, and to report any adverse reaction to me immediately.

## 2023-10-22 ENCOUNTER — Ambulatory Visit (INDEPENDENT_AMBULATORY_CARE_PROVIDER_SITE_OTHER): Payer: BC Managed Care – PPO

## 2023-10-22 DIAGNOSIS — E538 Deficiency of other specified B group vitamins: Secondary | ICD-10-CM | POA: Diagnosis not present

## 2023-10-22 MED ORDER — CYANOCOBALAMIN 1000 MCG/ML IJ SOLN
1000.0000 ug | Freq: Once | INTRAMUSCULAR | Status: AC
  Administered 2023-10-22: 1000 ug via INTRAMUSCULAR

## 2023-10-22 NOTE — Progress Notes (Signed)
After obtaining consent, and per orders of Dr. Elvera Lennox, injection of Vitamin B12 given by [name]. Patient instructed to remain in clinic for 20 minutes afterwards, and to report any adverse reaction to me immediately.

## 2023-10-24 IMAGING — MR MR CERVICAL SPINE WO/W CM
6 of 8 series · 32 of 48 positions shown · IV contrast (12 ml Multihance)
Comparison: None Available.

CLINICAL DATA: Left-sided neck and shoulder pain.

EXAM:
MRI CERVICAL SPINE WITHOUT AND WITH CONTRAST
TECHNIQUE: Multiplanar and multiecho pulse sequences of the cervical spine, to
include the craniocervical junction and cervicothoracic junction,
were obtained without and with intravenous contrast.
CONTRAST:  12mL MULTIHANCE GADOBENATE DIMEGLUMINE 529 MG/ML IV SOLN

[Series 2: T1 · sagittal · 3.0mm · 0.82mm/px · 4 of 15 slices shown (1 of 2)]
[im 1/15]
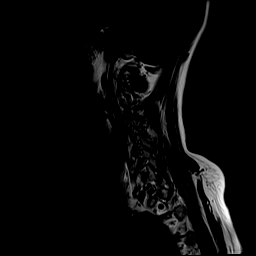
[im 5/15]
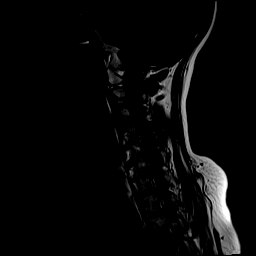
[im 10/15]
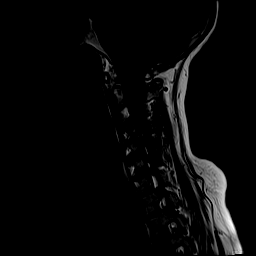
[im 15/15]
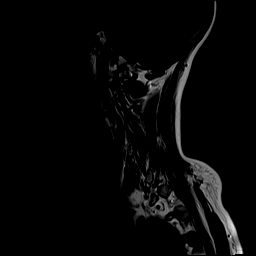

[Series 3: STIR · sagittal · 3.0mm · 0.82mm/px · 4 of 15 slices shown]
[im 1/15]
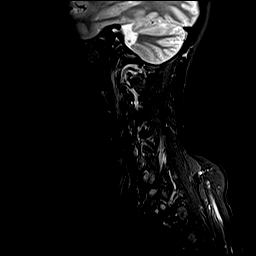
[im 5/15]
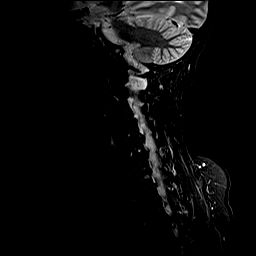
[im 10/15]
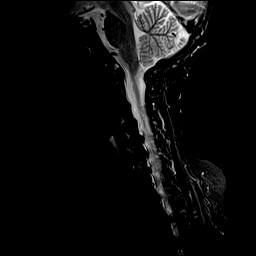
[im 15/15]
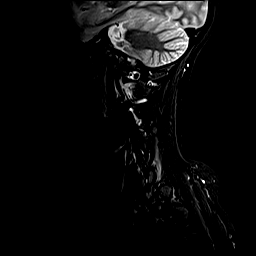

[Series 4: T2 · axial · 3.0mm · 0.70mm/px · z∈[-53,+34]mm · 8 of 25 slices shown (1 of 2)]
[im 1/25]
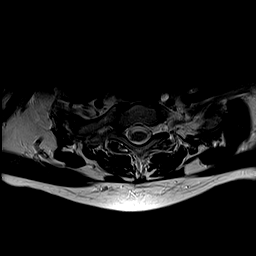
[im 4/25]
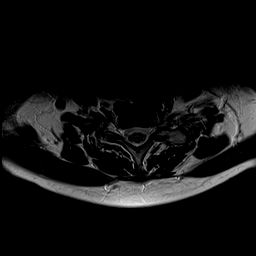
[im 7/25]
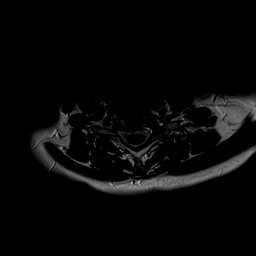
[im 11/25]
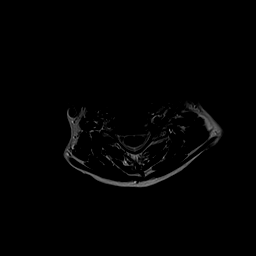
[im 14/25]
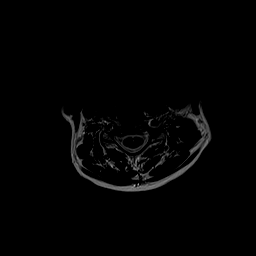
[im 18/25]
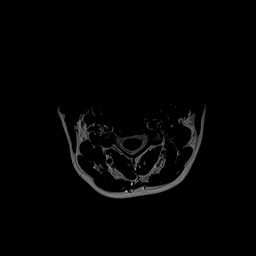
[im 21/25]
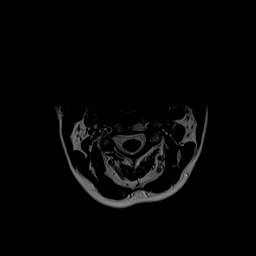
[im 25/25]
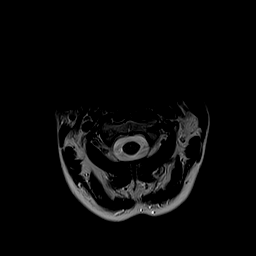

[Series 6: T1 · axial · 3.0mm · 0.35mm/px · z∈[-53,+34]mm · 8 of 25 slices shown (2 of 2)]
[im 1/25]
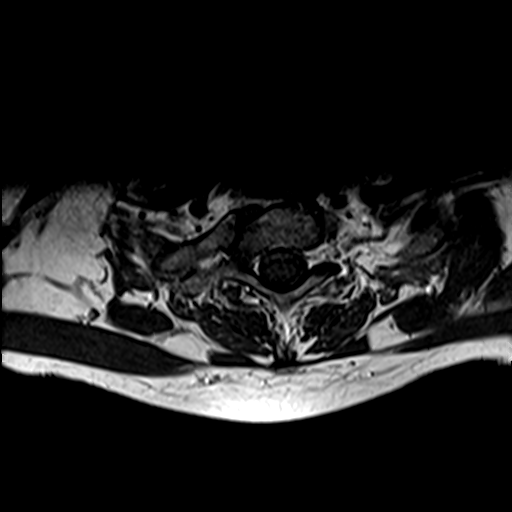
[im 4/25]
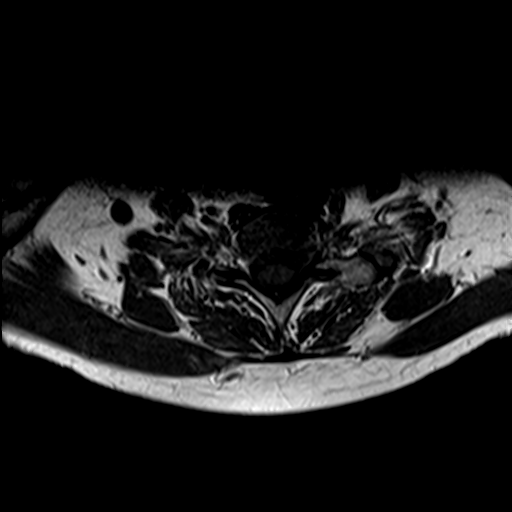
[im 7/25]
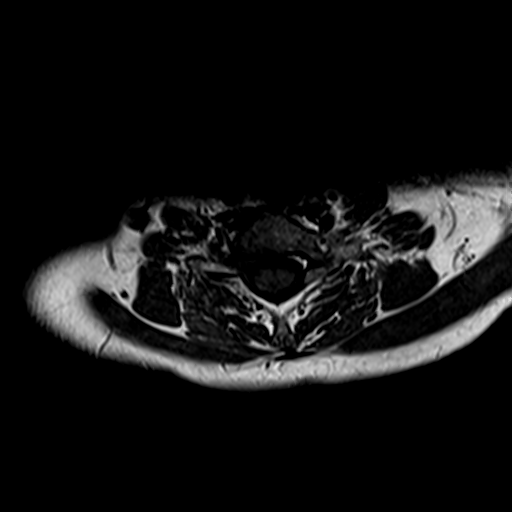
[im 11/25]
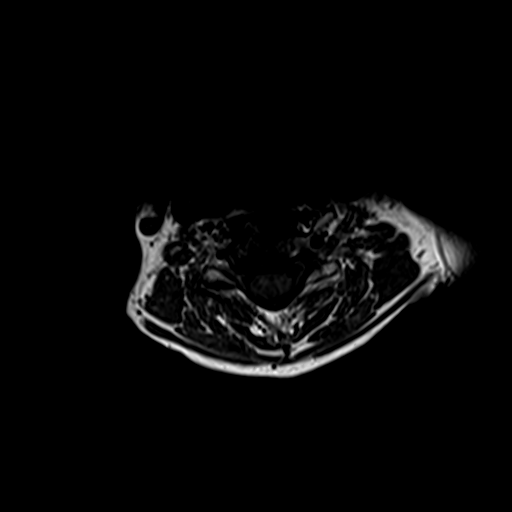
[im 14/25]
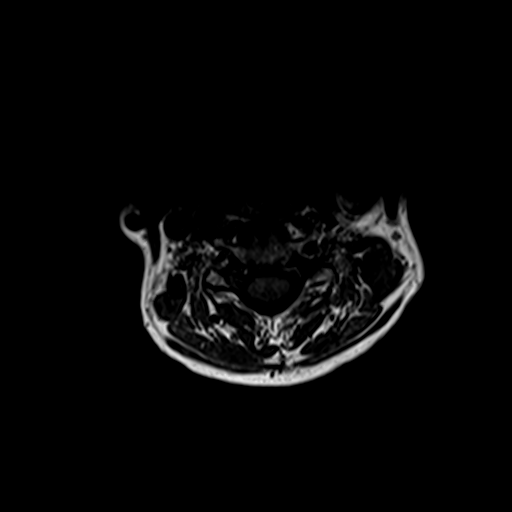
[im 18/25]
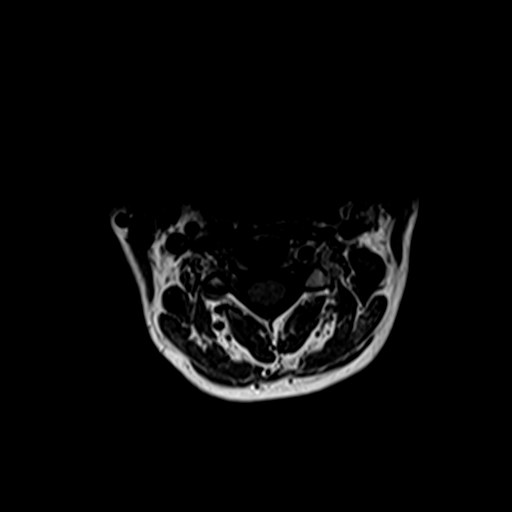
[im 21/25]
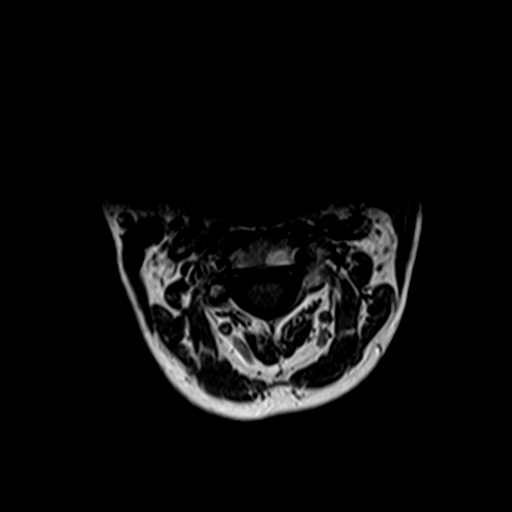
[im 25/25]
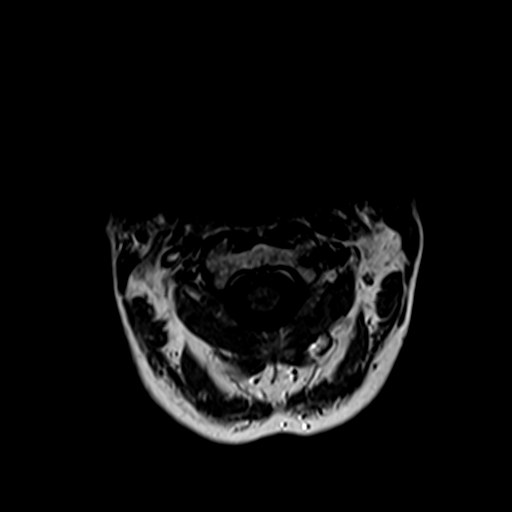

[Series 8: T2 · sagittal · 3.0mm · 0.41mm/px · 4 of 15 slices shown (2 of 2)]
[im 1/15]
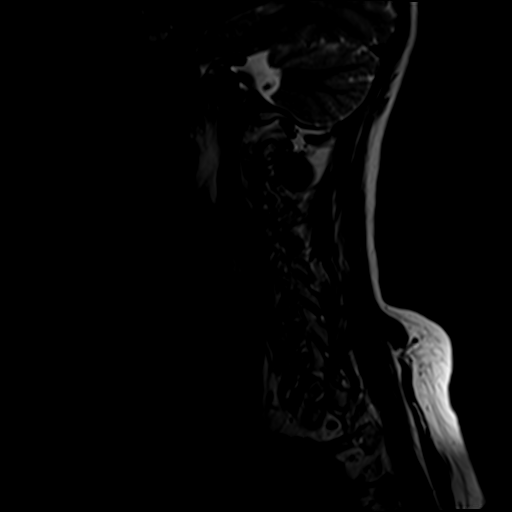
[im 5/15]
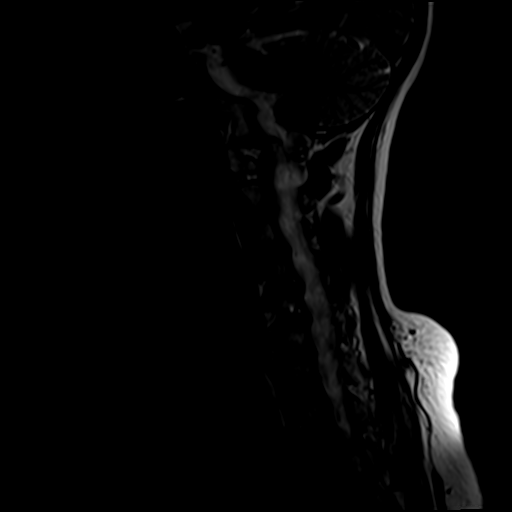
[im 10/15]
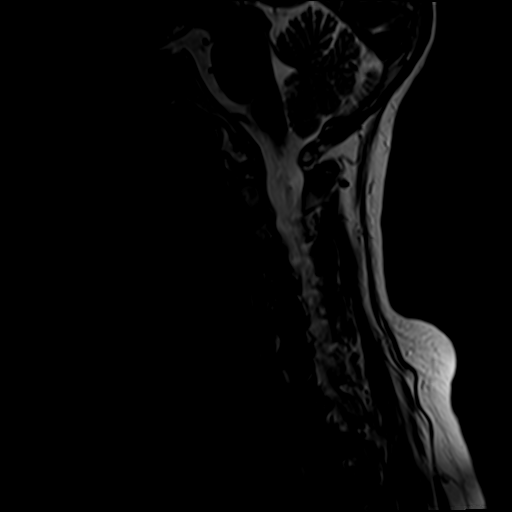
[im 15/15]
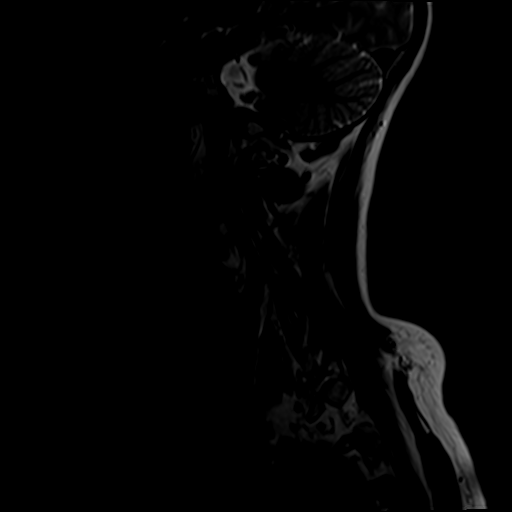

[Series 9: T1 fat-sat post-contrast · sagittal · 3.0mm · 0.82mm/px · 4 of 15 slices shown]
[im 1/15]
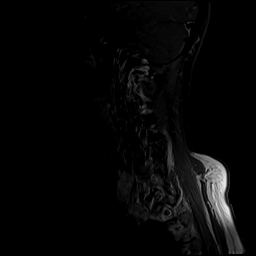
[im 5/15]
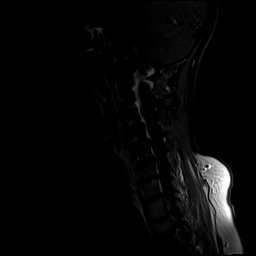
[im 10/15]
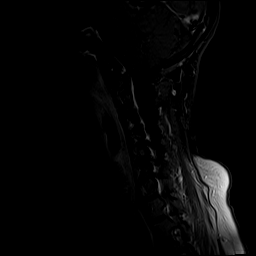
[im 15/15]
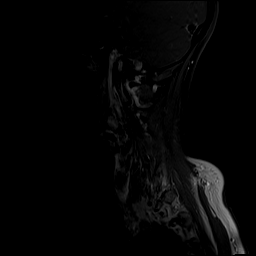

[32 of 48 positions shown; findings below may reference images not displayed]

FINDINGS: Alignment: No static listhesis. Loss of the normal cervical lordosis
with straightening.

Vertebrae: No acute fracture, evidence of discitis, or aggressive
bone lesion.

Cord: Normal signal and morphology.

Posterior Fossa, vertebral arteries, paraspinal tissues: Posterior
fossa demonstrates no focal abnormality. Vertebral artery flow voids
are maintained. Paraspinal soft tissues are unremarkable.

Disc levels:

Discs: Degenerative disease with disc height loss at C4-5, C5-6 and
C6-7.

C2-3: No significant disc bulge. No neural foraminal stenosis. No
central canal stenosis.

C3-4: Mild broad-based disc bulge. No foraminal or central canal
stenosis.

C4-5: Mild broad-based disc bulge. Bilateral uncovertebral
degenerative changes. Severe bilateral foraminal stenosis. No spinal
stenosis.

C5-6: Broad-based disc bulge. Bilateral uncovertebral degenerative
changes. Severe bilateral foraminal stenosis. No spinal stenosis.

C6-7: Broad-based disc bulge. Bilateral uncovertebral degenerative
changes. Moderate bilateral foraminal stenosis. No spinal stenosis.

C7-T1: No significant disc bulge. No neural foraminal stenosis. No
central canal stenosis.
IMPRESSION: 1. At C4-5 there is a mild broad-based disc bulge. Bilateral
uncovertebral degenerative changes. Severe bilateral foraminal
stenosis.
2. At C5-6 there is a broad-based disc bulge. Bilateral
uncovertebral degenerative changes. Severe bilateral foraminal
stenosis.
3. At C6-7 there is a broad-based disc bulge. Bilateral
uncovertebral degenerative changes. Moderate bilateral foraminal
stenosis.
4. No aggressive osseous lesion to suggest metastatic disease.

## 2023-10-29 ENCOUNTER — Ambulatory Visit (INDEPENDENT_AMBULATORY_CARE_PROVIDER_SITE_OTHER): Payer: BC Managed Care – PPO

## 2023-10-29 DIAGNOSIS — E538 Deficiency of other specified B group vitamins: Secondary | ICD-10-CM | POA: Diagnosis not present

## 2023-10-29 MED ORDER — CYANOCOBALAMIN 1000 MCG/ML IJ SOLN
1000.0000 ug | Freq: Once | INTRAMUSCULAR | Status: AC
  Administered 2023-10-29: 1000 ug via INTRAMUSCULAR

## 2023-10-29 NOTE — Progress Notes (Signed)
After obtaining consent, and per orders of Dr. Elvera Lennox, injection of Vitamin B12 given by [name]. Patient instructed to remain in clinic for 20 minutes afterwards, and to report any adverse reaction to me immediately.

## 2023-11-05 ENCOUNTER — Ambulatory Visit: Payer: BC Managed Care – PPO

## 2023-11-12 ENCOUNTER — Ambulatory Visit (INDEPENDENT_AMBULATORY_CARE_PROVIDER_SITE_OTHER): Payer: BC Managed Care – PPO

## 2023-11-12 DIAGNOSIS — R7989 Other specified abnormal findings of blood chemistry: Secondary | ICD-10-CM | POA: Diagnosis not present

## 2023-11-12 DIAGNOSIS — E538 Deficiency of other specified B group vitamins: Secondary | ICD-10-CM | POA: Diagnosis not present

## 2023-11-12 MED ORDER — CYANOCOBALAMIN 1000 MCG/ML IJ SOLN
1000.0000 ug | Freq: Once | INTRAMUSCULAR | Status: AC
  Administered 2023-11-12: 1000 ug via INTRAMUSCULAR

## 2023-11-12 NOTE — Progress Notes (Signed)
 After obtaining consent, and per orders of Dr. Elvera Lennox, injection of Vitamin B12 given by Tera Partridge. Patient instructed to remain in clinic for 20 minutes afterwards, and to report any adverse reaction to me immediately.

## 2023-11-19 ENCOUNTER — Ambulatory Visit (INDEPENDENT_AMBULATORY_CARE_PROVIDER_SITE_OTHER): Payer: BC Managed Care – PPO

## 2023-11-19 DIAGNOSIS — E538 Deficiency of other specified B group vitamins: Secondary | ICD-10-CM | POA: Diagnosis not present

## 2023-11-19 DIAGNOSIS — R7989 Other specified abnormal findings of blood chemistry: Secondary | ICD-10-CM

## 2023-11-19 MED ORDER — CYANOCOBALAMIN 1000 MCG/ML IJ SOLN
1000.0000 ug | Freq: Once | INTRAMUSCULAR | Status: AC
  Administered 2023-11-19: 1000 ug via INTRAMUSCULAR

## 2023-11-19 NOTE — Progress Notes (Signed)
After obtaining consent, and per orders of Dr. Elvera Lennox, injection of Vitamin B12 given by Tera Partridge. Patient instructed to remain in clinic for 20 minutes afterwards, and to report any adverse reaction to me immediately.

## 2023-11-26 ENCOUNTER — Ambulatory Visit (INDEPENDENT_AMBULATORY_CARE_PROVIDER_SITE_OTHER): Payer: BC Managed Care – PPO

## 2023-11-26 DIAGNOSIS — E538 Deficiency of other specified B group vitamins: Secondary | ICD-10-CM | POA: Diagnosis not present

## 2023-11-26 DIAGNOSIS — R7989 Other specified abnormal findings of blood chemistry: Secondary | ICD-10-CM

## 2023-11-26 MED ORDER — CYANOCOBALAMIN 1000 MCG/ML IJ SOLN
1000.0000 ug | Freq: Once | INTRAMUSCULAR | Status: AC
  Administered 2023-11-26: 1000 ug via INTRAMUSCULAR

## 2023-11-26 NOTE — Progress Notes (Signed)
 After obtaining consent, and per orders of Dr. Elvera Lennox, injection of Vitamin B12 given by Tera Partridge. Patient instructed to remain in clinic for 20 minutes afterwards, and to report any adverse reaction to me immediately.

## 2023-12-10 ENCOUNTER — Ambulatory Visit: Payer: BC Managed Care – PPO

## 2023-12-10 DIAGNOSIS — R7989 Other specified abnormal findings of blood chemistry: Secondary | ICD-10-CM

## 2023-12-10 DIAGNOSIS — E538 Deficiency of other specified B group vitamins: Secondary | ICD-10-CM | POA: Diagnosis not present

## 2023-12-10 MED ORDER — CYANOCOBALAMIN 1000 MCG/ML IJ SOLN
1000.0000 ug | Freq: Once | INTRAMUSCULAR | Status: AC
  Administered 2023-12-10: 1000 ug via INTRAMUSCULAR

## 2023-12-10 NOTE — Progress Notes (Signed)
After obtaining consent, and per orders of Dr. Elvera Lennox, injection of Vitamin B12 given by Tera Partridge. Patient instructed to remain in clinic for 20 minutes afterwards, and to report any adverse reaction to me immediately.

## 2023-12-17 ENCOUNTER — Ambulatory Visit: Payer: BC Managed Care – PPO

## 2023-12-17 DIAGNOSIS — R7989 Other specified abnormal findings of blood chemistry: Secondary | ICD-10-CM

## 2023-12-17 DIAGNOSIS — E538 Deficiency of other specified B group vitamins: Secondary | ICD-10-CM

## 2023-12-17 MED ORDER — CYANOCOBALAMIN 1000 MCG/ML IJ SOLN
1000.0000 ug | Freq: Once | INTRAMUSCULAR | Status: AC
  Administered 2023-12-17: 1000 ug via INTRAMUSCULAR

## 2023-12-17 NOTE — Progress Notes (Signed)
 After obtaining consent, and per orders of Dr. Elvera Lennox, injection of Vitamin B12 given by Tera Partridge. Patient instructed to remain in clinic for 20 minutes afterwards, and to report any adverse reaction to me immediately.

## 2023-12-24 ENCOUNTER — Ambulatory Visit: Payer: BC Managed Care – PPO

## 2023-12-24 DIAGNOSIS — E538 Deficiency of other specified B group vitamins: Secondary | ICD-10-CM | POA: Diagnosis not present

## 2023-12-24 MED ORDER — CYANOCOBALAMIN 1000 MCG/ML IJ SOLN
1000.0000 ug | Freq: Once | INTRAMUSCULAR | Status: AC
Start: 2023-12-24 — End: 2023-12-24
  Administered 2023-12-24: 1000 ug via INTRAMUSCULAR

## 2023-12-24 NOTE — Progress Notes (Signed)
After obtaining consent, and per orders of Dr. Elvera Lennox, injection of B12 given by [name]. Patient instructed to remain in clinic for 20 minutes afterwards, and to report any adverse reaction to me immediately.

## 2024-01-07 ENCOUNTER — Ambulatory Visit

## 2024-01-07 DIAGNOSIS — E538 Deficiency of other specified B group vitamins: Secondary | ICD-10-CM

## 2024-01-13 MED ORDER — CYANOCOBALAMIN 1000 MCG/ML IJ SOLN
1000.0000 ug | Freq: Once | INTRAMUSCULAR | Status: AC
Start: 2024-01-13 — End: 2024-01-07
  Administered 2024-01-07: 1000 ug via INTRAMUSCULAR

## 2024-01-13 NOTE — Progress Notes (Signed)
After obtaining consent, and per orders of Dr. Elvera Lennox, injection of Vitamin B12 given by [name]. Patient instructed to remain in clinic for 20 minutes afterwards, and to report any adverse reaction to me immediately.

## 2024-01-14 ENCOUNTER — Ambulatory Visit

## 2024-01-14 DIAGNOSIS — E538 Deficiency of other specified B group vitamins: Secondary | ICD-10-CM | POA: Diagnosis not present

## 2024-01-14 MED ORDER — CYANOCOBALAMIN 1000 MCG/ML IJ SOLN
1000.0000 ug | Freq: Once | INTRAMUSCULAR | Status: AC
  Administered 2024-01-14: 1000 ug via INTRAMUSCULAR

## 2024-01-14 NOTE — Progress Notes (Signed)
 After obtaining consent, and per orders of Dr. Elvera Lennox , injection of Vitamin B12 given by Beverely Pace. Patient instructed to remain in clinic for 20 minutes afterwards, and to report any adverse reaction to me immediately.

## 2024-01-21 ENCOUNTER — Ambulatory Visit

## 2024-01-28 ENCOUNTER — Other Ambulatory Visit: Payer: Self-pay | Admitting: Neurological Surgery

## 2024-01-28 ENCOUNTER — Ambulatory Visit

## 2024-01-28 DIAGNOSIS — E538 Deficiency of other specified B group vitamins: Secondary | ICD-10-CM

## 2024-01-28 DIAGNOSIS — M5412 Radiculopathy, cervical region: Secondary | ICD-10-CM

## 2024-01-28 MED ORDER — CYANOCOBALAMIN 1000 MCG/ML IJ SOLN
1000.0000 ug | Freq: Once | INTRAMUSCULAR | Status: AC
  Administered 2024-01-28: 1000 ug via INTRAMUSCULAR

## 2024-01-28 NOTE — Progress Notes (Signed)
After obtaining consent, and per orders of Dr. Elvera Lennox, injection of Vitamin B12 given by [name]. Patient instructed to remain in clinic for 20 minutes afterwards, and to report any adverse reaction to me immediately.

## 2024-02-01 ENCOUNTER — Other Ambulatory Visit: Payer: Self-pay | Admitting: Neurological Surgery

## 2024-02-01 DIAGNOSIS — M5412 Radiculopathy, cervical region: Secondary | ICD-10-CM

## 2024-02-04 ENCOUNTER — Ambulatory Visit

## 2024-02-09 ENCOUNTER — Ambulatory Visit

## 2024-02-18 ENCOUNTER — Encounter: Payer: Self-pay | Admitting: Neurology

## 2024-02-25 ENCOUNTER — Ambulatory Visit

## 2024-02-25 DIAGNOSIS — E538 Deficiency of other specified B group vitamins: Secondary | ICD-10-CM

## 2024-02-25 MED ORDER — CYANOCOBALAMIN 1000 MCG/ML IJ SOLN
1000.0000 ug | Freq: Once | INTRAMUSCULAR | Status: AC
  Administered 2024-02-25: 1000 ug via INTRAMUSCULAR

## 2024-02-25 NOTE — Progress Notes (Signed)
 PT was in office today for B12 injection with no problem.

## 2024-03-01 ENCOUNTER — Ambulatory Visit

## 2024-03-08 ENCOUNTER — Ambulatory Visit (INDEPENDENT_AMBULATORY_CARE_PROVIDER_SITE_OTHER)

## 2024-03-08 DIAGNOSIS — E538 Deficiency of other specified B group vitamins: Secondary | ICD-10-CM

## 2024-03-09 MED ORDER — CYANOCOBALAMIN 1000 MCG/ML IJ SOLN
1000.0000 ug | Freq: Once | INTRAMUSCULAR | Status: AC
Start: 2024-03-09 — End: 2024-03-08
  Administered 2024-03-08: 1000 ug via INTRAMUSCULAR

## 2024-03-09 NOTE — Progress Notes (Signed)
 Injection of Vitamin B12 was given in the Left arm.

## 2024-03-15 ENCOUNTER — Ambulatory Visit (INDEPENDENT_AMBULATORY_CARE_PROVIDER_SITE_OTHER)

## 2024-03-15 DIAGNOSIS — E538 Deficiency of other specified B group vitamins: Secondary | ICD-10-CM

## 2024-03-15 MED ORDER — CYANOCOBALAMIN 1000 MCG/ML IJ SOLN
1000.0000 ug | Freq: Once | INTRAMUSCULAR | Status: AC
  Administered 2024-03-15: 1000 ug via INTRAMUSCULAR

## 2024-03-15 NOTE — Progress Notes (Signed)
 Pt was in office today for weekly b12 injection given in the right arm with no problem.

## 2024-03-22 ENCOUNTER — Ambulatory Visit

## 2024-03-22 ENCOUNTER — Other Ambulatory Visit: Payer: Self-pay

## 2024-03-22 DIAGNOSIS — R202 Paresthesia of skin: Secondary | ICD-10-CM

## 2024-03-28 ENCOUNTER — Encounter: Admitting: Neurology

## 2024-03-29 ENCOUNTER — Ambulatory Visit

## 2024-04-05 ENCOUNTER — Ambulatory Visit

## 2024-04-12 ENCOUNTER — Ambulatory Visit

## 2024-04-19 ENCOUNTER — Ambulatory Visit

## 2024-05-09 ENCOUNTER — Telehealth: Payer: Self-pay

## 2024-05-09 NOTE — Telephone Encounter (Signed)
Please get patient scheduled.  °

## 2024-05-09 NOTE — Telephone Encounter (Signed)
 Requesting refill on the B12 but I don't see that the patient has been seen for office visit.

## 2024-05-09 NOTE — Telephone Encounter (Signed)
 Last seen 09/2022.

## 2024-05-09 NOTE — Telephone Encounter (Signed)
 She needs to schedule another appt within the next 3 mo to refill.

## 2024-05-09 NOTE — Telephone Encounter (Signed)
LMx1 to schedule follow up appointment.

## 2024-05-10 ENCOUNTER — Ambulatory Visit (INDEPENDENT_AMBULATORY_CARE_PROVIDER_SITE_OTHER): Payer: Self-pay

## 2024-05-10 DIAGNOSIS — E538 Deficiency of other specified B group vitamins: Secondary | ICD-10-CM | POA: Diagnosis not present

## 2024-05-10 MED ORDER — CYANOCOBALAMIN 1000 MCG/ML IJ SOLN
1000.0000 ug | Freq: Once | INTRAMUSCULAR | Status: AC
  Administered 2024-05-10: 1000 ug via INTRAMUSCULAR

## 2024-05-10 NOTE — Progress Notes (Signed)
 Medication: Vitamin B12 Dosage:1,000 mcg Location: L arm Injection given ab:[id number],MFJ Provider: Lela Trixie COME

## 2024-05-10 NOTE — Telephone Encounter (Signed)
 No

## 2024-05-10 NOTE — Telephone Encounter (Signed)
 Pt needs to be seen by MD she has not been seen since 2023 for refills.

## 2024-05-17 ENCOUNTER — Ambulatory Visit

## 2024-05-17 DIAGNOSIS — E538 Deficiency of other specified B group vitamins: Secondary | ICD-10-CM

## 2024-05-17 MED ORDER — CYANOCOBALAMIN 1000 MCG/ML IJ SOLN
1000.0000 ug | Freq: Once | INTRAMUSCULAR | Status: AC
  Administered 2024-05-17: 1000 ug via INTRAMUSCULAR

## 2024-05-17 NOTE — Progress Notes (Signed)
 Medication: Vitamin B12 Dosage:1000 mcg Location: L arm  Injection given ab:[id number],MFJ Provider: Lela Trixie COME  Co-Sign is under Dr. Sam because Dr. Trixie is out of the office.

## 2024-06-23 ENCOUNTER — Ambulatory Visit: Admitting: Internal Medicine

## 2024-07-21 ENCOUNTER — Ambulatory Visit: Admitting: Internal Medicine
